# Patient Record
Sex: Female | Born: 1985
Health system: Southern US, Community
[De-identification: ages and names within clinical notes are randomized; demographics above are authoritative.]

## PROBLEM LIST (undated history)

## (undated) ENCOUNTER — Inpatient Hospital Stay (HOSPITAL_COMMUNITY): Payer: Self-pay

## (undated) DIAGNOSIS — D649 Anemia, unspecified: Secondary | ICD-10-CM

## (undated) DIAGNOSIS — F329 Major depressive disorder, single episode, unspecified: Secondary | ICD-10-CM

## (undated) DIAGNOSIS — N83209 Unspecified ovarian cyst, unspecified side: Secondary | ICD-10-CM

## (undated) DIAGNOSIS — A749 Chlamydial infection, unspecified: Secondary | ICD-10-CM

## (undated) DIAGNOSIS — F32A Depression, unspecified: Secondary | ICD-10-CM

## (undated) DIAGNOSIS — N390 Urinary tract infection, site not specified: Secondary | ICD-10-CM

## (undated) DIAGNOSIS — I10 Essential (primary) hypertension: Secondary | ICD-10-CM

## (undated) HISTORY — PX: NO PAST SURGERIES: SHX2092

---

## 2003-08-09 ENCOUNTER — Emergency Department (HOSPITAL_COMMUNITY): Admission: EM | Admit: 2003-08-09 | Discharge: 2003-08-10 | Payer: Self-pay

## 2004-01-07 ENCOUNTER — Encounter (INDEPENDENT_AMBULATORY_CARE_PROVIDER_SITE_OTHER): Payer: Self-pay | Admitting: *Deleted

## 2004-01-07 LAB — CONVERTED CEMR LAB

## 2004-01-10 ENCOUNTER — Encounter: Admission: RE | Admit: 2004-01-10 | Discharge: 2004-01-10 | Payer: Self-pay | Admitting: Family Medicine

## 2004-01-10 ENCOUNTER — Other Ambulatory Visit: Admission: RE | Admit: 2004-01-10 | Discharge: 2004-01-10 | Payer: Self-pay | Admitting: Family Medicine

## 2004-01-13 ENCOUNTER — Encounter: Admission: RE | Admit: 2004-01-13 | Discharge: 2004-01-13 | Payer: Self-pay | Admitting: Sports Medicine

## 2004-04-24 ENCOUNTER — Encounter: Admission: RE | Admit: 2004-04-24 | Discharge: 2004-04-24 | Payer: Self-pay | Admitting: Sports Medicine

## 2004-05-22 ENCOUNTER — Encounter: Admission: RE | Admit: 2004-05-22 | Discharge: 2004-05-22 | Payer: Self-pay | Admitting: Family Medicine

## 2004-06-10 ENCOUNTER — Encounter: Admission: RE | Admit: 2004-06-10 | Discharge: 2004-06-10 | Payer: Self-pay | Admitting: Family Medicine

## 2004-07-14 ENCOUNTER — Encounter: Admission: RE | Admit: 2004-07-14 | Discharge: 2004-07-14 | Payer: Self-pay | Admitting: Family Medicine

## 2004-07-31 ENCOUNTER — Encounter: Admission: RE | Admit: 2004-07-31 | Discharge: 2004-07-31 | Payer: Self-pay | Admitting: Family Medicine

## 2004-08-09 ENCOUNTER — Inpatient Hospital Stay (HOSPITAL_COMMUNITY): Admission: AD | Admit: 2004-08-09 | Discharge: 2004-08-10 | Payer: Self-pay | Admitting: *Deleted

## 2004-08-21 ENCOUNTER — Ambulatory Visit: Payer: Self-pay | Admitting: Family Medicine

## 2004-09-10 ENCOUNTER — Ambulatory Visit: Payer: Self-pay | Admitting: Family Medicine

## 2004-09-25 ENCOUNTER — Ambulatory Visit: Payer: Self-pay | Admitting: Family Medicine

## 2004-10-16 ENCOUNTER — Ambulatory Visit: Payer: Self-pay | Admitting: Family Medicine

## 2004-10-16 ENCOUNTER — Inpatient Hospital Stay (HOSPITAL_COMMUNITY): Admission: AD | Admit: 2004-10-16 | Discharge: 2004-10-16 | Payer: Self-pay | Admitting: Family Medicine

## 2004-10-18 ENCOUNTER — Inpatient Hospital Stay (HOSPITAL_COMMUNITY): Admission: AD | Admit: 2004-10-18 | Discharge: 2004-10-18 | Payer: Self-pay | Admitting: *Deleted

## 2004-10-24 ENCOUNTER — Inpatient Hospital Stay (HOSPITAL_COMMUNITY): Admission: AD | Admit: 2004-10-24 | Discharge: 2004-10-24 | Payer: Self-pay | Admitting: Obstetrics and Gynecology

## 2004-10-28 ENCOUNTER — Ambulatory Visit: Payer: Self-pay | Admitting: Family Medicine

## 2004-12-13 ENCOUNTER — Inpatient Hospital Stay (HOSPITAL_COMMUNITY): Admission: AD | Admit: 2004-12-13 | Discharge: 2004-12-13 | Payer: Self-pay | Admitting: Obstetrics & Gynecology

## 2004-12-17 ENCOUNTER — Ambulatory Visit: Payer: Self-pay | Admitting: Family Medicine

## 2004-12-29 ENCOUNTER — Ambulatory Visit: Payer: Self-pay | Admitting: *Deleted

## 2004-12-29 ENCOUNTER — Inpatient Hospital Stay (HOSPITAL_COMMUNITY): Admission: AD | Admit: 2004-12-29 | Discharge: 2005-01-01 | Payer: Self-pay | Admitting: *Deleted

## 2005-02-17 ENCOUNTER — Encounter (INDEPENDENT_AMBULATORY_CARE_PROVIDER_SITE_OTHER): Payer: Self-pay | Admitting: *Deleted

## 2005-02-17 ENCOUNTER — Other Ambulatory Visit: Admission: RE | Admit: 2005-02-17 | Discharge: 2005-02-17 | Payer: Self-pay | Admitting: Family Medicine

## 2005-02-17 ENCOUNTER — Ambulatory Visit: Payer: Self-pay | Admitting: Family Medicine

## 2006-06-30 ENCOUNTER — Emergency Department (HOSPITAL_COMMUNITY): Admission: EM | Admit: 2006-06-30 | Discharge: 2006-06-30 | Payer: Self-pay | Admitting: Emergency Medicine

## 2007-02-02 DIAGNOSIS — F17200 Nicotine dependence, unspecified, uncomplicated: Secondary | ICD-10-CM

## 2007-02-02 DIAGNOSIS — N83209 Unspecified ovarian cyst, unspecified side: Secondary | ICD-10-CM | POA: Insufficient documentation

## 2007-02-03 ENCOUNTER — Encounter (INDEPENDENT_AMBULATORY_CARE_PROVIDER_SITE_OTHER): Payer: Self-pay | Admitting: *Deleted

## 2007-06-15 ENCOUNTER — Emergency Department (HOSPITAL_COMMUNITY): Admission: EM | Admit: 2007-06-15 | Discharge: 2007-06-15 | Payer: Self-pay | Admitting: Emergency Medicine

## 2008-10-29 ENCOUNTER — Ambulatory Visit: Payer: Self-pay | Admitting: Family Medicine

## 2008-10-29 ENCOUNTER — Encounter (INDEPENDENT_AMBULATORY_CARE_PROVIDER_SITE_OTHER): Payer: Self-pay | Admitting: Family Medicine

## 2008-10-29 DIAGNOSIS — E663 Overweight: Secondary | ICD-10-CM | POA: Insufficient documentation

## 2008-10-29 LAB — CONVERTED CEMR LAB
Chlamydia, DNA Probe: NEGATIVE
GC Probe Amp, Genital: NEGATIVE
Pap Smear: NORMAL

## 2008-10-30 ENCOUNTER — Encounter (INDEPENDENT_AMBULATORY_CARE_PROVIDER_SITE_OTHER): Payer: Self-pay | Admitting: Family Medicine

## 2008-11-04 ENCOUNTER — Encounter (INDEPENDENT_AMBULATORY_CARE_PROVIDER_SITE_OTHER): Payer: Self-pay | Admitting: Family Medicine

## 2008-12-09 ENCOUNTER — Emergency Department (HOSPITAL_COMMUNITY): Admission: EM | Admit: 2008-12-09 | Discharge: 2008-12-09 | Payer: Self-pay | Admitting: Emergency Medicine

## 2009-01-27 ENCOUNTER — Emergency Department (HOSPITAL_COMMUNITY): Admission: EM | Admit: 2009-01-27 | Discharge: 2009-01-27 | Payer: Self-pay | Admitting: Neurology

## 2009-01-29 ENCOUNTER — Inpatient Hospital Stay (HOSPITAL_COMMUNITY): Admission: AD | Admit: 2009-01-29 | Discharge: 2009-01-29 | Payer: Self-pay | Admitting: Obstetrics & Gynecology

## 2009-01-31 ENCOUNTER — Inpatient Hospital Stay (HOSPITAL_COMMUNITY): Admission: RE | Admit: 2009-01-31 | Discharge: 2009-01-31 | Payer: Self-pay | Admitting: Obstetrics & Gynecology

## 2009-02-12 ENCOUNTER — Ambulatory Visit: Payer: Self-pay | Admitting: Family Medicine

## 2009-02-12 ENCOUNTER — Encounter: Payer: Self-pay | Admitting: Family Medicine

## 2009-02-12 LAB — CONVERTED CEMR LAB
Band Neutrophils: 0 % (ref 0–10)
Basophils Absolute: 0 10*3/uL (ref 0.0–0.1)
Basophils Relative: 0 % (ref 0–1)
HCT: 37.4 % (ref 36.0–46.0)
Hepatitis B Surface Ag: NEGATIVE
Monocytes Absolute: 0.6 10*3/uL (ref 0.1–1.0)
Neutro Abs: 5.5 10*3/uL (ref 1.7–7.7)
Neutrophils Relative %: 69 % (ref 43–77)
Platelets: 218 10*3/uL (ref 150–400)
RDW: 12.5 % (ref 11.5–15.5)
Rh Type: POSITIVE
WBC: 8 10*3/uL (ref 4.0–10.5)

## 2009-02-26 ENCOUNTER — Encounter: Payer: Self-pay | Admitting: Family Medicine

## 2009-02-26 ENCOUNTER — Ambulatory Visit: Payer: Self-pay | Admitting: Family Medicine

## 2009-02-26 LAB — CONVERTED CEMR LAB
Bilirubin Urine: NEGATIVE
GC Probe Amp, Genital: NEGATIVE
Glucose, Urine, Semiquant: NEGATIVE
Nitrite: NEGATIVE
Specific Gravity, Urine: 1.02
WBC Urine, dipstick: NEGATIVE
pH: 8

## 2009-03-03 ENCOUNTER — Encounter: Payer: Self-pay | Admitting: Family Medicine

## 2009-03-06 ENCOUNTER — Ambulatory Visit: Payer: Self-pay | Admitting: Family Medicine

## 2009-03-06 DIAGNOSIS — F39 Unspecified mood [affective] disorder: Secondary | ICD-10-CM

## 2009-03-11 ENCOUNTER — Ambulatory Visit: Payer: Self-pay | Admitting: Family Medicine

## 2009-03-11 ENCOUNTER — Inpatient Hospital Stay (HOSPITAL_COMMUNITY): Admission: AD | Admit: 2009-03-11 | Discharge: 2009-03-11 | Payer: Self-pay | Admitting: Obstetrics & Gynecology

## 2009-03-11 ENCOUNTER — Telehealth (INDEPENDENT_AMBULATORY_CARE_PROVIDER_SITE_OTHER): Payer: Self-pay | Admitting: Family Medicine

## 2009-03-11 LAB — CONVERTED CEMR LAB
Bilirubin Urine: NEGATIVE
Glucose, Urine, Semiquant: NEGATIVE
Ketones, urine, test strip: NEGATIVE
Nitrite: NEGATIVE
Protein, U semiquant: 30
Specific Gravity, Urine: 1.025
Urobilinogen, UA: 2
WBC Urine, dipstick: NEGATIVE
pH: 6.5

## 2009-03-27 ENCOUNTER — Other Ambulatory Visit: Payer: Self-pay | Admitting: *Deleted

## 2009-03-28 ENCOUNTER — Ambulatory Visit: Payer: Self-pay | Admitting: Family Medicine

## 2009-03-28 ENCOUNTER — Other Ambulatory Visit: Payer: Self-pay | Admitting: *Deleted

## 2009-03-28 ENCOUNTER — Other Ambulatory Visit: Payer: Self-pay | Admitting: Emergency Medicine

## 2009-03-28 ENCOUNTER — Inpatient Hospital Stay (HOSPITAL_COMMUNITY): Admission: AD | Admit: 2009-03-28 | Discharge: 2009-03-31 | Payer: Self-pay | Admitting: Internal Medicine

## 2009-03-28 ENCOUNTER — Ambulatory Visit: Payer: Self-pay | Admitting: Pulmonary Disease

## 2009-04-07 ENCOUNTER — Ambulatory Visit: Payer: Self-pay | Admitting: Family Medicine

## 2009-04-07 ENCOUNTER — Encounter: Payer: Self-pay | Admitting: Family Medicine

## 2009-04-28 ENCOUNTER — Encounter: Payer: Self-pay | Admitting: Family Medicine

## 2009-04-28 ENCOUNTER — Ambulatory Visit (HOSPITAL_COMMUNITY): Admission: RE | Admit: 2009-04-28 | Discharge: 2009-04-28 | Payer: Self-pay | Admitting: Family Medicine

## 2009-04-30 ENCOUNTER — Encounter: Payer: Self-pay | Admitting: Family Medicine

## 2009-05-07 ENCOUNTER — Ambulatory Visit: Payer: Self-pay | Admitting: Family Medicine

## 2009-05-19 ENCOUNTER — Telehealth: Payer: Self-pay | Admitting: *Deleted

## 2009-05-20 ENCOUNTER — Encounter: Payer: Self-pay | Admitting: Family Medicine

## 2009-05-23 ENCOUNTER — Encounter: Payer: Self-pay | Admitting: Family Medicine

## 2009-06-05 ENCOUNTER — Ambulatory Visit: Payer: Self-pay | Admitting: Family Medicine

## 2009-06-05 ENCOUNTER — Encounter: Payer: Self-pay | Admitting: Family Medicine

## 2009-06-06 ENCOUNTER — Encounter: Payer: Self-pay | Admitting: Family Medicine

## 2009-07-07 ENCOUNTER — Ambulatory Visit: Payer: Self-pay | Admitting: Family Medicine

## 2009-07-07 ENCOUNTER — Encounter: Payer: Self-pay | Admitting: Family Medicine

## 2009-07-07 LAB — CONVERTED CEMR LAB
RBC: 3.82 M/uL — ABNORMAL LOW (ref 3.87–5.11)
WBC: 6.6 10*3/uL (ref 4.0–10.5)

## 2009-07-11 ENCOUNTER — Encounter: Payer: Self-pay | Admitting: Family Medicine

## 2009-07-11 ENCOUNTER — Ambulatory Visit: Payer: Self-pay | Admitting: Family Medicine

## 2009-07-22 ENCOUNTER — Ambulatory Visit: Payer: Self-pay | Admitting: Family Medicine

## 2009-07-22 LAB — CONVERTED CEMR LAB
Bilirubin Urine: NEGATIVE
Glucose, Urine, Semiquant: 100
Ketones, urine, test strip: NEGATIVE
Specific Gravity, Urine: 1.025

## 2009-07-31 ENCOUNTER — Encounter: Payer: Self-pay | Admitting: Family Medicine

## 2009-08-04 ENCOUNTER — Encounter: Payer: Self-pay | Admitting: Family Medicine

## 2009-08-13 ENCOUNTER — Ambulatory Visit: Payer: Self-pay | Admitting: Family Medicine

## 2009-08-15 ENCOUNTER — Ambulatory Visit (HOSPITAL_COMMUNITY): Admission: RE | Admit: 2009-08-15 | Discharge: 2009-08-15 | Payer: Self-pay | Admitting: Family Medicine

## 2009-08-15 ENCOUNTER — Encounter: Payer: Self-pay | Admitting: Family Medicine

## 2009-08-29 ENCOUNTER — Encounter: Payer: Self-pay | Admitting: Family Medicine

## 2009-08-29 ENCOUNTER — Ambulatory Visit: Payer: Self-pay | Admitting: Family Medicine

## 2009-09-05 ENCOUNTER — Ambulatory Visit: Payer: Self-pay | Admitting: Family Medicine

## 2009-09-12 ENCOUNTER — Ambulatory Visit: Payer: Self-pay | Admitting: Family Medicine

## 2009-09-12 ENCOUNTER — Encounter: Payer: Self-pay | Admitting: Family Medicine

## 2009-09-18 ENCOUNTER — Ambulatory Visit: Payer: Self-pay | Admitting: Family Medicine

## 2009-09-25 ENCOUNTER — Ambulatory Visit: Payer: Self-pay | Admitting: Family Medicine

## 2009-09-28 ENCOUNTER — Inpatient Hospital Stay (HOSPITAL_COMMUNITY): Admission: AD | Admit: 2009-09-28 | Discharge: 2009-09-30 | Payer: Self-pay | Admitting: Family Medicine

## 2009-09-28 ENCOUNTER — Ambulatory Visit: Payer: Self-pay | Admitting: Advanced Practice Midwife

## 2009-11-03 ENCOUNTER — Ambulatory Visit: Payer: Self-pay | Admitting: Family Medicine

## 2009-11-10 ENCOUNTER — Telehealth: Payer: Self-pay | Admitting: *Deleted

## 2009-11-10 ENCOUNTER — Encounter: Payer: Self-pay | Admitting: Family Medicine

## 2009-11-21 ENCOUNTER — Telehealth: Payer: Self-pay | Admitting: *Deleted

## 2010-01-30 IMAGING — US US OB COMP LESS 14 WK
1 series · 14 of 28 positions shown · non-contrast
Comparison: 06/20/2006

CLINICAL DATA: OBSTETRIC <14 WK ULTRASOUND,TRANSVAGINAL OB ULTRASOUND - MODIFY
TECHNIQUE: Cramps/left abdominal pain/early pregnancy

[Series 1: us ob comp less 14 wk · 0.21mm/px · 14 of 70 slices shown]
[im 3/70]
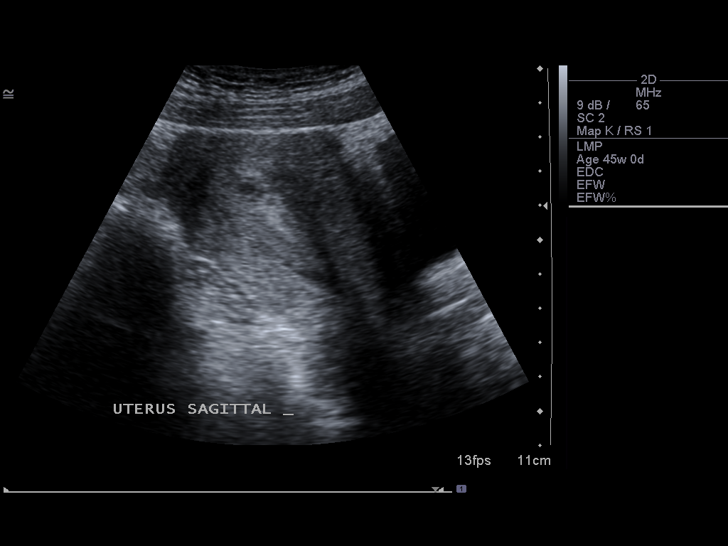
[im 8/70]
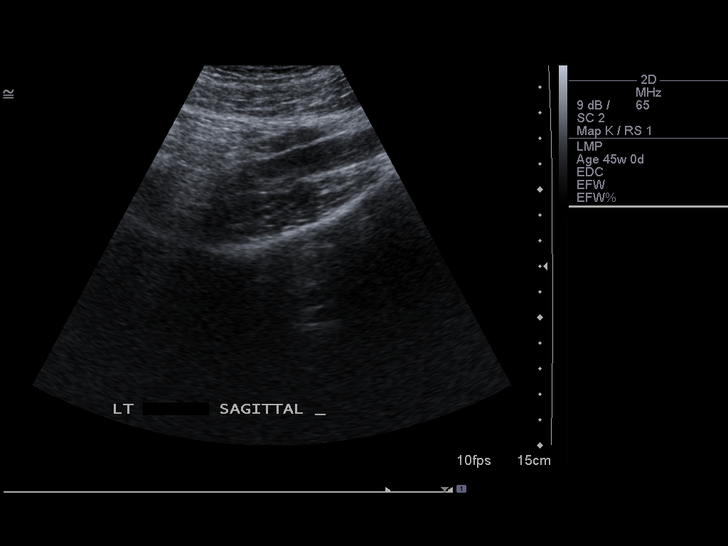
[im 13/70]
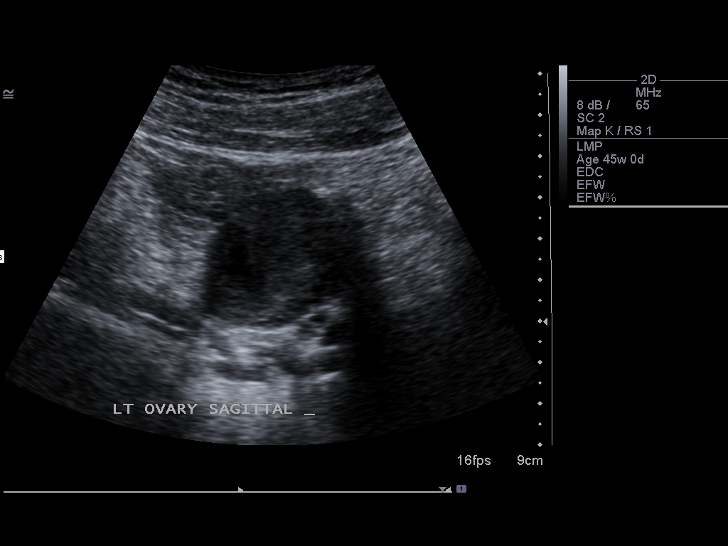
[im 18/70]
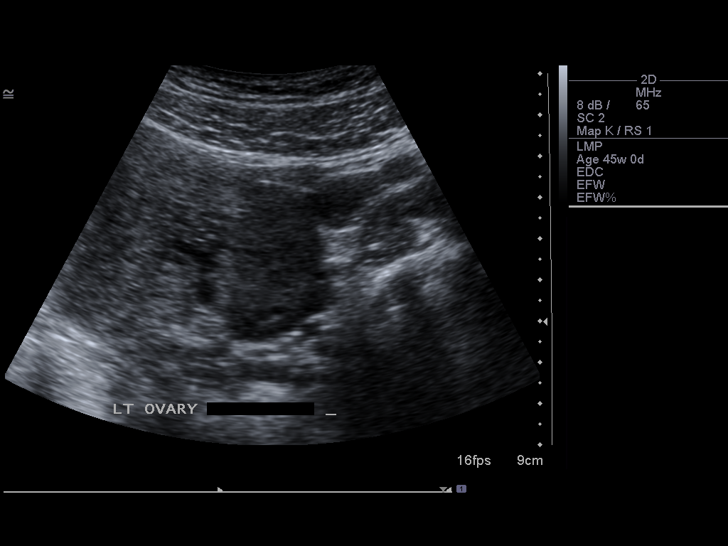
[im 24/70]
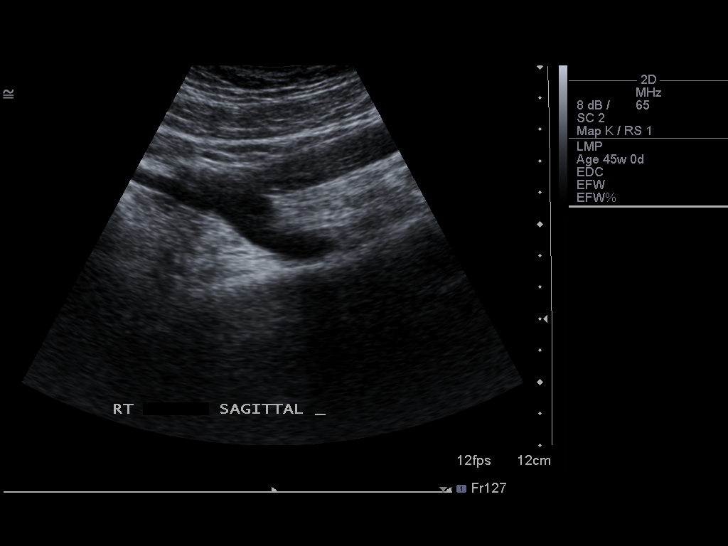
[im 29/70]
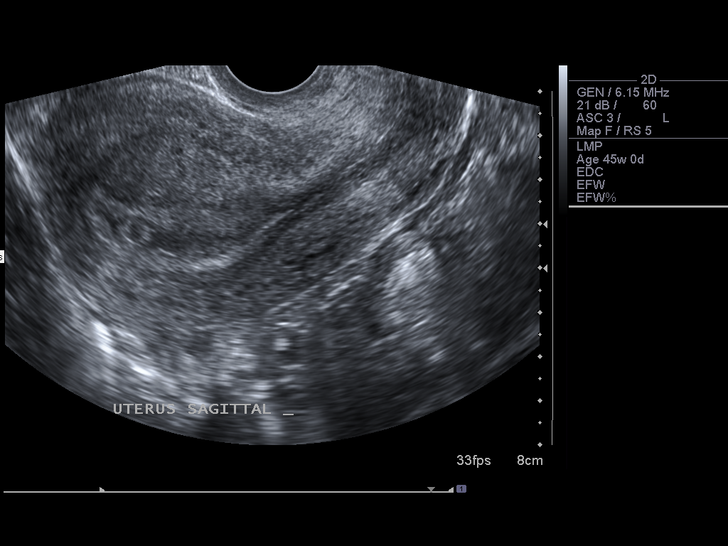
[im 34/70]
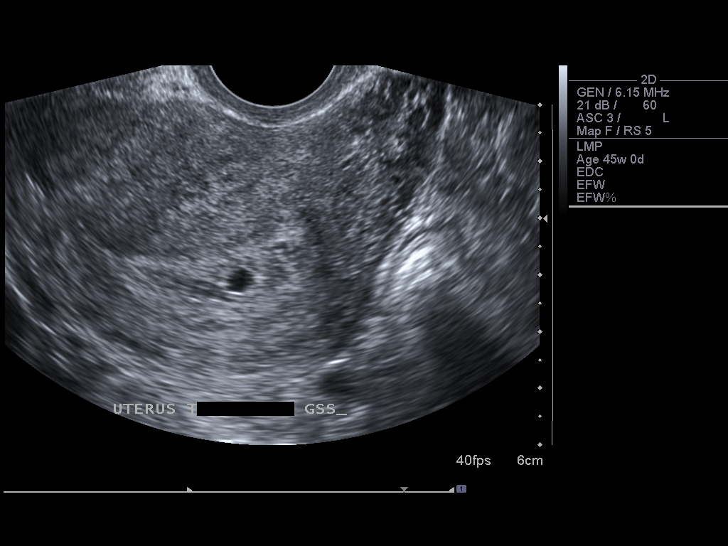
[im 39/70]
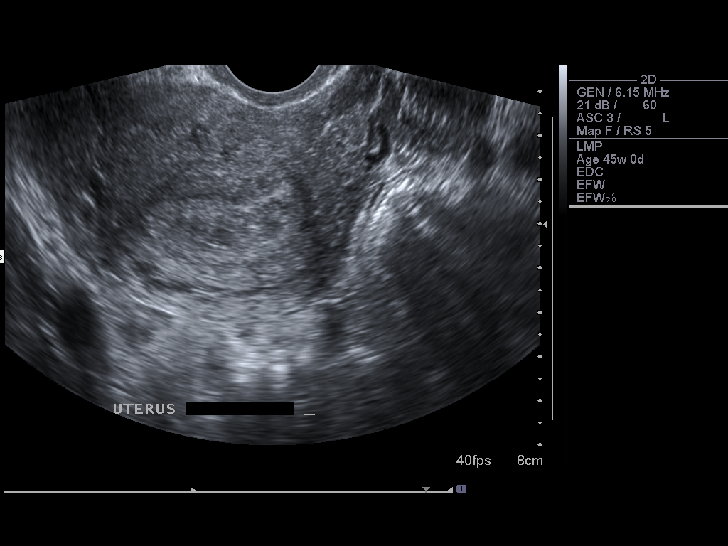
[im 44/70]
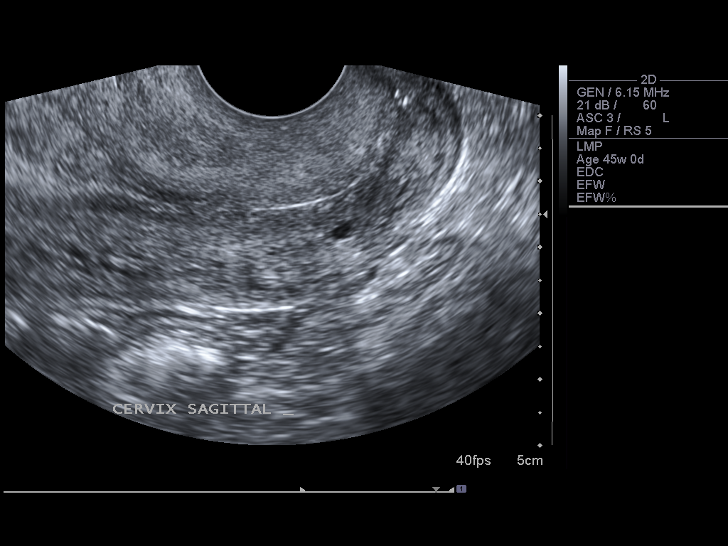
[im 49/70]
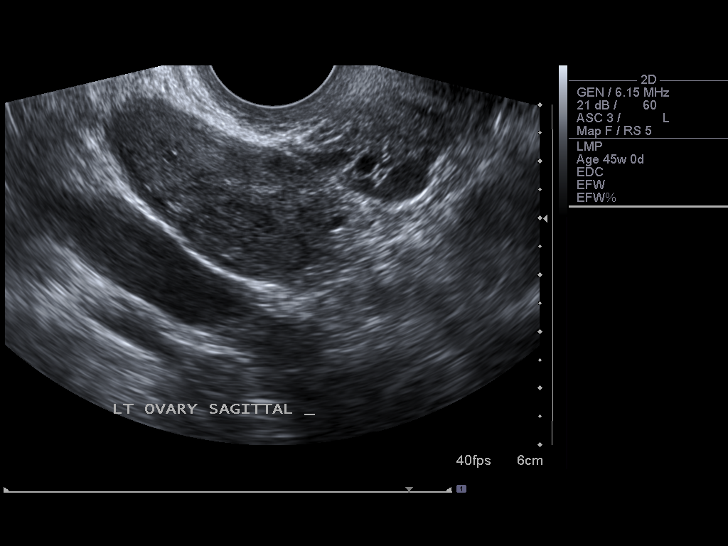
[im 54/70]
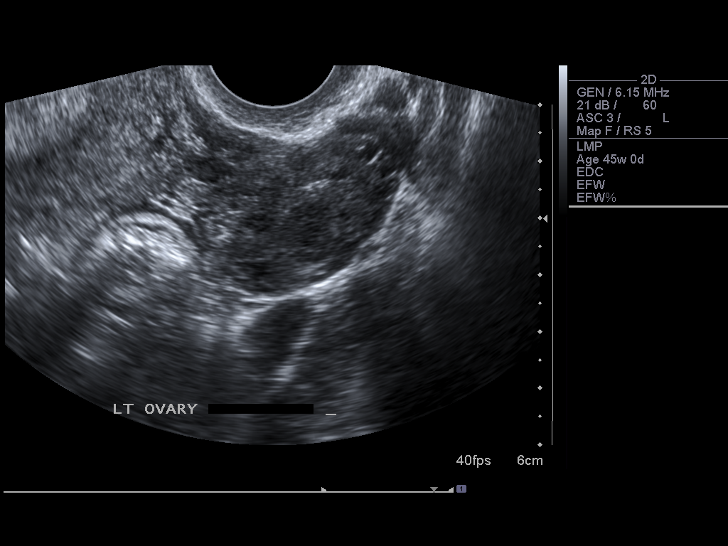
[im 59/70]
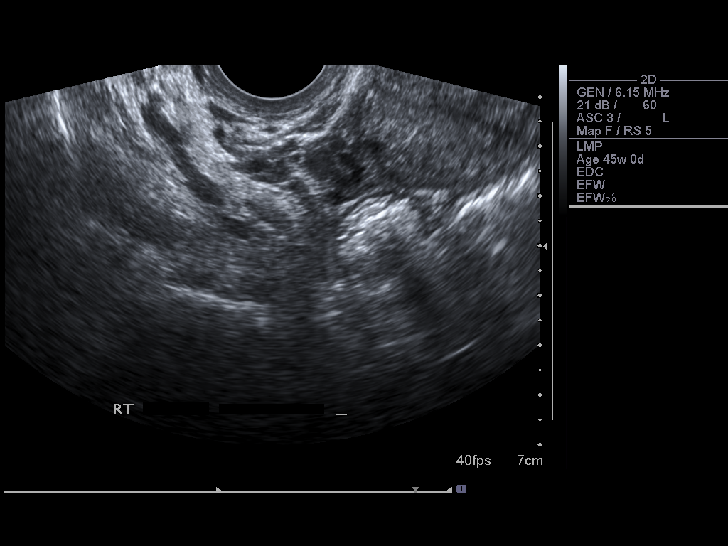
[im 64/70]
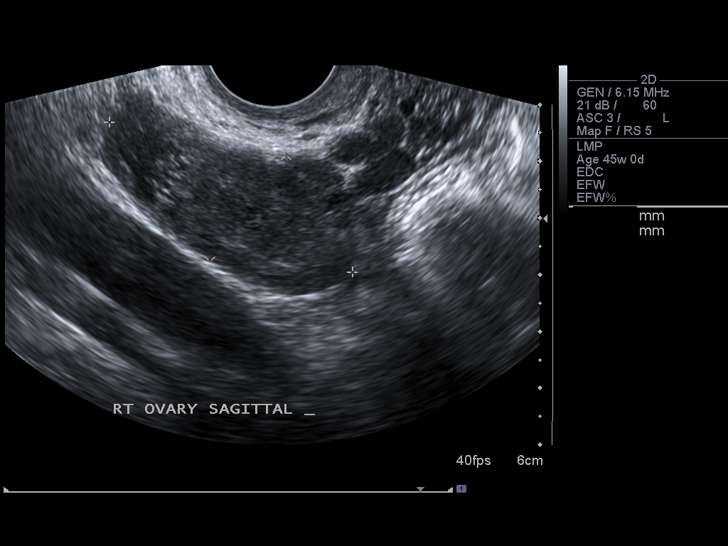
[im 70/70]
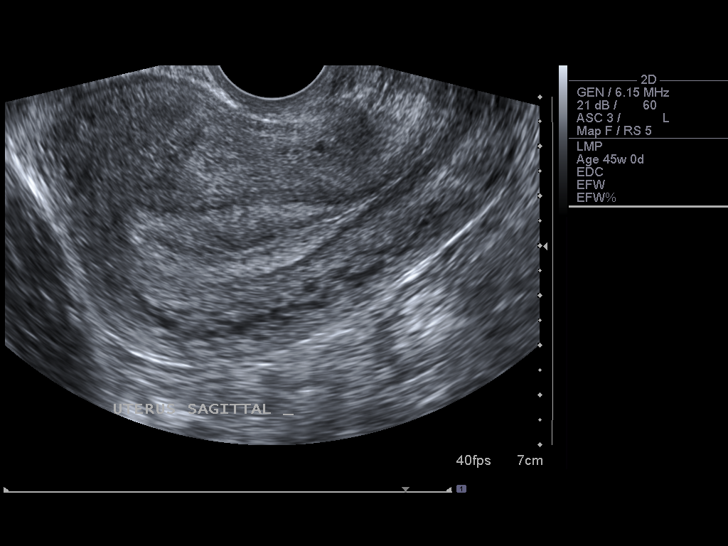

[14 of 28 positions shown; findings below may reference images not displayed]

FINDINGS: There is an intrauterine gestational sac measuring
mm.  This is consistent with a gestational age of 5 weeks 1 day.
At this time there is no yolk sac or visible fetus.

Right ovary normal.  Left ovary shows a 2 cm thick-walled cyst that
may be an involuting corpus luteum cyst.  No other pathological
findings. No free pelvic fluid.
IMPRESSION: 1.  Early intrauterine pregnancy estimated at 5 weeks 1 day based
on gestational sac diameter.
2.  At this time there is no visible yolk sac or embryo.
3.  Thick-walled left ovarian cyst measuring about 2 cm.

## 2010-12-27 ENCOUNTER — Encounter: Payer: Self-pay | Admitting: Family Medicine

## 2010-12-28 ENCOUNTER — Encounter: Payer: Self-pay | Admitting: Family Medicine

## 2011-01-05 NOTE — Assessment & Plan Note (Signed)
Summary: pain in L side & nausea   Vital Signs:  Patient profile:   25 year old female Height:      70 inches Weight:      186 pounds Temp:     99.2 degrees F oral Pulse rate:   92 / minute BP sitting:   122 / 77  (right arm)  Vitals Entered By: deseree blount/sma CC: abdominal pain, pregnant Is Patient Diabetic? No Pain Assessment Patient in pain? yes     Location: abdomen Intensity: 9   History of Present Illness: [name] is a 25 year old G3P1011 at 11.[redacted] weeks GA presenting for left abdominal pain.  1. Abdominal Pain: x 1 day, described as intermittent, sharp, cramping, 9/10, lower left abdomen. She stated that yesterday, she felt fine in the am and afternoon, ate a [employer] biscuit, donuts, chips, pizza. That evening, she began to feel abdominal pain. She has had several BMs- soft, green, no blood or mucus- with mild relief. Sleeping and sitting in the fetal position helps. Nothing seems to make worse. She endorses nausea but denies vomiting.   The patient had an OB US on 3/26 that showed that she did have an intrauterine pregnancy, a left corpus luteum, size = dates. She denies vaginal bleeding or discharge, contractions, changes in urination, recent STD exposure, Hx of kidney stones, sick contacts.  Habits & Providers     Tobacco Status: never  Current Medications (verified): 1)  Prenatal Vitamins 0.8 Mg Tabs (Prenatal Multivit-Min-Fe-Fa) .... One Tablet By Mouth Daily 2)  Vitamin B-6 25 Mg Tabs (Pyridoxine Hcl) .... By Mouth One Tablet Every 6 Hour For Nausea 3)  Unisom 25 Mg Tabs (Doxylamine Succinate (Sleep)) .... One Tablet By Mouth Every 6-8 Hours For Nausea.  May Cause Sleepiness.  Allergies: No Known Drug Allergies  Past History:  Past Medical History:    U/S revealing polycystic ovaries 2/05    Migraines (10/29/2008)  Review of Systems       Per HPI, otherwise negative  Physical Exam  General:  Well-developed,well-nourished,in no acute distress;  alert,appropriate and cooperative throughout examination. vital signs reviewed. Heart:  Normal rate and regular rhythm. S1 and S2 normal without gallop, murmur, click, rub or other extra sounds. Abdomen:  Soft, non-tender, normal bowel sounds, no guarding, no rigidity, no hepatomegaly, no splenomegaly, no CVA tenderness.  Attempted to hear heartbeat with doptone, but was not successful.  Skin:  Intact without suspicious lesions or rashes   Impression & Recommendations:  Problem # 1:  GASTROENTERITIS (ICD-558.9) Assessment New  Abdominal pain consistent with gastroenteritis. NO RED FLAGS. Discussed RED FLAGS for promptly seeking medical care with patient.  Orders: [facility]- Est Level  3 (16109)  Problem # 2:  PREGNANCY, NORMAL (ICD-V22.2) Assessment: Unchanged 25 year old G7P1011 (elective abortion) at 11 weeks, 1 day GA. The patient was anxious after I was unsuccessful in hearing heartbeat. I explained that it it still very early in her pregnancy and that it was not surprising that we did not hear them. She has an US showing an IUP. She denies bleeding or fluid per vagina. Vital signs normal. NO RED FLAGS. Likely GE as above. Advised patient to follow up with PCP (Dr. Janalyn Harder for OB) in 1 week. Given Red Flags. Precepted patient with Dr. Leveda Anna.  Orders: Urinalysis-[facility] (00000) [facility]- Est Level  3 (60454)  Complete Medication List: 1)  Prenatal Vitamins 0.8 Mg Tabs (Prenatal multivit-min-fe-fa) .... One tablet by mouth daily 2)  Vitamin B-6 25  Mg Tabs (Pyridoxine hcl) .... By mouth one tablet every 6 hour for nausea 3)  Unisom 25 Mg Tabs (Doxylamine succinate (sleep)) .... One tablet by mouth every 6-8 hours for nausea.  may cause sleepiness.  Patient Instructions: 1)  Follow up with Dr. Janalyn Harder in 1-2 weeks. 2)  If you have any bleeding or increased cramping, please call the [facility] or go to the [facility] Emergency Dept. 3)  Make sure that you drink pleanty of  fluids.  Laboratory Results   Urine Tests  Date/Time Received: March 11, 2009 3:00 PM  Date/Time Reported: March 11, 2009 3:17 PM   Routine Urinalysis   Color: yellow Appearance: Clear Glucose: negative   (Normal Range: Negative) Bilirubin: negative   (Normal Range: Negative) Ketone: negative   (Normal Range: Negative) Spec. Gravity: 1.025   (Normal Range: 1.003-1.035) Blood: trace-intact   (Normal Range: Negative) pH: 6.5   (Normal Range: 5.0-8.0) Protein: 30   (Normal Range: Negative) Urobilinogen: 2.0   (Normal Range: 0-1) Nitrite: negative   (Normal Range: Negative) Leukocyte Esterace: negative   (Normal Range: Negative)  Urine Microscopic WBC/HPF: occ RBC/HPF: 0-3 Bacteria/HPF: 2+ cocci Epithelial/HPF: 5-10 with occ clue cell    Comments: ...............test performed by......Marland KitchenBonnie A. [place], [place] (ASCP)

## 2011-03-11 LAB — CBC
Hemoglobin: 10 g/dL — ABNORMAL LOW (ref 12.0–15.0)
Hemoglobin: 11.6 g/dL — ABNORMAL LOW (ref 12.0–15.0)
MCHC: 33.4 g/dL (ref 30.0–36.0)
MCHC: 33.9 g/dL (ref 30.0–36.0)
MCV: 87.8 fL (ref 78.0–100.0)
RBC: 3.9 MIL/uL (ref 3.87–5.11)
RDW: 13.1 % (ref 11.5–15.5)
WBC: 7.9 10*3/uL (ref 4.0–10.5)

## 2011-03-11 LAB — COMPREHENSIVE METABOLIC PANEL
AST: 34 U/L (ref 0–37)
Albumin: 3.1 g/dL — ABNORMAL LOW (ref 3.5–5.2)
Alkaline Phosphatase: 131 U/L — ABNORMAL HIGH (ref 39–117)
BUN: 10 mg/dL (ref 6–23)
CO2: 21 mEq/L (ref 19–32)
Chloride: 104 mEq/L (ref 96–112)
Creatinine, Ser: 0.69 mg/dL (ref 0.4–1.2)
GFR calc Af Amer: >60 mL/min (ref >60)
GFR calc non Af Amer: >60 mL/min (ref >60)
Potassium: 4 mEq/L (ref 3.5–5.1)
Total Bilirubin: 0.7 mg/dL (ref 0.3–1.2)

## 2011-03-11 LAB — RPR: RPR Ser Ql: NONREACTIVE

## 2011-03-13 LAB — GLUCOSE, CAPILLARY: Glucose-Capillary: 154 mg/dL — ABNORMAL HIGH (ref 70–99)

## 2011-03-17 LAB — MAGNESIUM
Magnesium: 1.9 mg/dL (ref 1.5–2.5)
Magnesium: 2 mg/dL (ref 1.5–2.5)

## 2011-03-17 LAB — URINALYSIS, ROUTINE W REFLEX MICROSCOPIC
Bilirubin Urine: NEGATIVE
Glucose, UA: 100 mg/dL — AB
Hgb urine dipstick: NEGATIVE
Ketones, ur: NEGATIVE mg/dL
Leukocytes, UA: NEGATIVE
Nitrite: NEGATIVE
Nitrite: NEGATIVE
Nitrite: NEGATIVE
Protein, ur: 100 mg/dL — AB
Specific Gravity, Urine: 1.01 (ref 1.005–1.030)
Urobilinogen, UA: 0.2 mg/dL (ref 0.0–1.0)
Urobilinogen, UA: 0.2 mg/dL (ref 0.0–1.0)
pH: 8.5 — ABNORMAL HIGH (ref 5.0–8.0)

## 2011-03-17 LAB — BASIC METABOLIC PANEL
BUN: 7 mg/dL (ref 6–23)
BUN: 4 mg/dL — ABNORMAL LOW (ref 6–23)
BUN: 5 mg/dL — ABNORMAL LOW (ref 6–23)
CO2: 16 mEq/L — ABNORMAL LOW (ref 19–32)
CO2: 24 mEq/L (ref 19–32)
Calcium: 8.2 mg/dL — ABNORMAL LOW (ref 8.4–10.5)
Calcium: 8.3 mg/dL — ABNORMAL LOW (ref 8.4–10.5)
Chloride: 111 mEq/L (ref 96–112)
Chloride: 106 mEq/L (ref 96–112)
Chloride: 108 mEq/L (ref 96–112)
Creatinine, Ser: 0.73 mg/dL (ref 0.4–1.2)
Creatinine, Ser: 0.9 mg/dL (ref 0.4–1.2)
GFR calc Af Amer: >60 mL/min (ref >60)
GFR calc Af Amer: >60 mL/min (ref >60)
GFR calc non Af Amer: >60 mL/min (ref >60)
GFR calc non Af Amer: >60 mL/min (ref >60)
GFR calc non Af Amer: >60 mL/min (ref >60)
Glucose, Bld: 130 mg/dL — ABNORMAL HIGH (ref 70–99)
Glucose, Bld: 80 mg/dL (ref 70–99)
Glucose, Bld: 93 mg/dL (ref 70–99)
Potassium: 2.8 mEq/L — ABNORMAL LOW (ref 3.5–5.1)
Potassium: 2.9 mEq/L — ABNORMAL LOW (ref 3.5–5.1)
Sodium: 133 mEq/L — ABNORMAL LOW (ref 135–145)
Sodium: 135 mEq/L (ref 135–145)
Sodium: 138 mEq/L (ref 135–145)

## 2011-03-17 LAB — COMPREHENSIVE METABOLIC PANEL
ALT: 21 U/L (ref 0–35)
Alkaline Phosphatase: 43 U/L (ref 39–117)
Chloride: 104 mEq/L (ref 96–112)
Glucose, Bld: 108 mg/dL — ABNORMAL HIGH (ref 70–99)
Potassium: 3.4 mEq/L — ABNORMAL LOW (ref 3.5–5.1)
Sodium: 135 mEq/L (ref 135–145)
Total Protein: 6.5 g/dL (ref 6.0–8.3)

## 2011-03-17 LAB — URINE MICROSCOPIC-ADD ON

## 2011-03-17 LAB — POCT I-STAT, CHEM 8
Calcium, Ion: 1.06 mmol/L — ABNORMAL LOW (ref 1.12–1.32)
Creatinine, Ser: 0.8 mg/dL (ref 0.4–1.2)
Glucose, Bld: 102 mg/dL — ABNORMAL HIGH (ref 70–99)
Hemoglobin: 12.2 g/dL (ref 12.0–15.0)
Sodium: 135 mEq/L (ref 135–145)
TCO2: 21 mmol/L (ref 0–100)

## 2011-03-17 LAB — POCT I-STAT 3, ART BLOOD GAS (G3+)
Acid-Base Excess: 2 mmol/L (ref 0.0–2.0)
Bicarbonate: 22.5 mEq/L (ref 20.0–24.0)
Bicarbonate: 24.1 mEq/L — ABNORMAL HIGH (ref 20.0–24.0)
Bicarbonate: 26.3 mEq/L — ABNORMAL HIGH (ref 20.0–24.0)
Patient temperature: 98.4
TCO2: 23 mmol/L (ref 0–100)
TCO2: 27 mmol/L (ref 0–100)
pCO2 arterial: 25.2 mmHg — ABNORMAL LOW (ref 35.0–45.0)
pH, Arterial: 7.435 — ABNORMAL HIGH (ref 7.350–7.400)
pH, Arterial: 7.589 — ABNORMAL HIGH (ref 7.350–7.400)
pO2, Arterial: 100 mmHg (ref 80.0–100.0)

## 2011-03-17 LAB — RAPID URINE DRUG SCREEN, HOSP PERFORMED
Amphetamines: NOT DETECTED
Tetrahydrocannabinol: NOT DETECTED

## 2011-03-17 LAB — WET PREP, GENITAL: Trich, Wet Prep: NONE SEEN

## 2011-03-17 LAB — CBC
Platelets: 178 10*3/uL (ref 150–400)
RDW: 12.2 % (ref 11.5–15.5)
WBC: 8.3 10*3/uL (ref 4.0–10.5)

## 2011-03-17 LAB — SALICYLATE LEVEL
Salicylate Lvl: 25.1 mg/dL — ABNORMAL HIGH (ref 2.8–20.0)
Salicylate Lvl: 16 mg/dL (ref 2.8–20.0)
Salicylate Lvl: 23.3 mg/dL — ABNORMAL HIGH (ref 2.8–20.0)

## 2011-03-17 LAB — TRICYCLICS SCREEN, URINE: TCA Scrn: NOT DETECTED

## 2011-03-17 LAB — GLUCOSE, CAPILLARY

## 2011-03-17 LAB — PREGNANCY, URINE: Preg Test, Ur: POSITIVE

## 2011-03-22 LAB — URINALYSIS, ROUTINE W REFLEX MICROSCOPIC
Bilirubin Urine: NEGATIVE
Glucose, UA: NEGATIVE mg/dL
Nitrite: NEGATIVE
Specific Gravity, Urine: 1.026 (ref 1.005–1.030)
pH: 7.5 (ref 5.0–8.0)

## 2011-03-22 LAB — POCT PREGNANCY, URINE: Preg Test, Ur: NEGATIVE

## 2011-03-23 LAB — DIFFERENTIAL
Basophils Absolute: 0 10*3/uL (ref 0.0–0.1)
Lymphocytes Relative: 23 % (ref 12–46)
Monocytes Absolute: 0.4 10*3/uL (ref 0.1–1.0)
Monocytes Relative: 6 % (ref 3–12)
Neutro Abs: 4.7 10*3/uL (ref 1.7–7.7)

## 2011-03-23 LAB — GC/CHLAMYDIA PROBE AMP, GENITAL: Chlamydia, DNA Probe: NEGATIVE

## 2011-03-23 LAB — URINALYSIS, ROUTINE W REFLEX MICROSCOPIC
Glucose, UA: NEGATIVE mg/dL
Protein, ur: NEGATIVE mg/dL
Urobilinogen, UA: 0.2 mg/dL (ref 0.0–1.0)

## 2011-03-23 LAB — CBC
HCT: 39.5 % (ref 36.0–46.0)
MCV: 85.2 fL (ref 78.0–100.0)
Platelets: 198 10*3/uL (ref 150–400)
RDW: 12.1 % (ref 11.5–15.5)

## 2011-03-23 LAB — COMPREHENSIVE METABOLIC PANEL
Albumin: 3.8 g/dL (ref 3.5–5.2)
BUN: 15 mg/dL (ref 6–23)
Creatinine, Ser: 0.89 mg/dL (ref 0.4–1.2)
Total Protein: 6.9 g/dL (ref 6.0–8.3)

## 2011-03-23 LAB — WET PREP, GENITAL: Yeast Wet Prep HPF POC: NONE SEEN

## 2011-04-20 NOTE — H&P (Signed)
NAMEJERMISHA, [name] NO.:  [id number]   MEDICAL RECORD NO.:  [id number]          PATIENT TYPE:  INP   LOCATION:  NA                           FACILITY:  [facility]   PHYSICIAN:  Loma Sender, MD         DATE OF BIRTH:  01-12-1986   DATE OF ADMISSION:  03/27/2009  DATE OF DISCHARGE:                              HISTORY & PHYSICAL   ADMITTING DIAGNOSIS:  Salicylate overdose.   HISTORY OF PRESENT ILLNESS:  [name] is a 25 year old female with a  past medical history significant for just completing her first trimester  of her second pregnancy who presents to the emergency department after  attempting suicide earlier this evening by ingesting approximately 125  tablets of aspirin with the strength of 325 mg tabs.  The patient  reports that she had been depressed lately and decided this evening that  she wanted to end her life.  At approximately 10:00 p.m., she started  ingesting 125 tablets of aspirin and did so in approximately a 30-minute  time frame.  Her boyfriend found her and called EMS who brought her to  the emergency department for evaluation.  In the emergency department,  she was administered activated charcoal at presentation and  approximately 2 hours after presentation.  The patient denies ingesting  any other medications but does report a history of suicidal attempts in  the past.  During our interview, the patient complained of tinnitus and  was diaphoretic.  Otherwise, the patient had no other complaints.   PAST MEDICAL HISTORY:  The patient is G1, P1, L1, otherwise the patient  denies any other past medical history.   MEDICATIONS:  The patient takes prenatal vitamins.   ALLERGIES:  The patient has no known allergies.   FAMILY HISTORY:  The patient denies any diseases run in her family.   SOCIAL HISTORY:  The patient was prior self-care [facility].  She works for [employer].  She does not smoke, use illicit drugs or  drink alcohol.  She  reports growing up in [place], but moved to [place] to be with her grandmother after growing up in a poor social  situation with her parents.   REVIEW OF SYSTEMS:  Complete review of systems was performed, otherwise  negative unless noted in the history of present illness.   PHYSICAL EXAM:  VITAL SIGNS: Temperature 98.4, blood pressure 119/61,  pulse 123, respiratory 22, satting 96% on room air.  GENERAL: [name] in no acute distress.  HEENT: The patient is diaphoretic at the brow.  Extraocular movement are  intact.  Mucous membranes are moist.  Nares are patent bilaterally.  The  patient is slightly hard of hearing, complained of tenderness.  NECK: Supple without lymphadenopathy, jugular venous distention or  carotid bruit.  The patient is tachypneic without respiratory distress.  LUNGS: Clear to auscultation bilaterally without wheezes or rhonchi.  HEART: Tachycardiac but without rubs, gallops or murmurs.  Pulses are 2+  at the wrist and ankle, no lower extremity edema.  ABDOMEN:  Soft,  nontender, nondistended, appropriate for her term of pregnancy.  NEUROLOGIC:  Cranial nerves II-XII are intact.  Muscle strength is  symmetric.  Sensation is grossly normal.  SKIN: No rashes or lesions are noted.  MENTAL STATUS.  The patient is alert and oriented x4.  Memory is intact  to recent and distant events.  Mood is depressed.  Affect is flat.  MUSCULOSKELETAL: No bony abnormalities, muscle tenderness or active  synovitis.   LABORATORY DATA:  Laboratory data at presentation:  INR was 1.1 and PTT  29.  CMP shows sodium of 135, potassium 3.4, chloride 104, bicarb 22,  glucose 108, BUN 7, creatinine 0.74, T bili 0.4, alk phos 43, AST 27,  ALT 21, total protein 6.5, albumin 3.5, calcium 9.3.  Alcohol level  undetectable, magnesium 1.9.  Acetaminophen level undetectable.  Salicylate level at 2345 on March 27, 2009 was 25.1.  Salicylate level  at 0224 April  23.2010 was 53.3.  Urinalysis was negative.  Urine  pregnancy was positive.  Urine drug screen was negative, Tricyclic level  was negative.  Repeat BMP on March 28, 2009 at 4 a.m.:  Sodium 137,  potassium 3.4, chloride 111, bicarb 16, glucose 130, BUN 7, creatinine  0.73, calcium 8.3.   ASSESSMENT/PLAN:  [name] who just completed her  first trimester pregnancy who presents with salicylate overdose in a  suicide attempt:  1. Salicylate overdose:  The patient currently has had 2 rounds of      charcoal but still shows a significant elevation in her salicylate      level as well as is objectively symptomatic with tinnitus and      hyperventilation with dropping bicarb.  At this point, we will      consult Nephrology for assistance with probable dialysis to be done      at the Intensive Care Unit at [facility].  We have  initiated the      patient on a bicarb drip at 250 mL an hour with 40 of KCL and we      will titrate this infusion with a goal urine output of 160 mL of      urine output an hour.  We will go ahead and place a Foley catheter      at this point.  We will monitor BMP, urine pH and salicylate level      q.2 h.  2. Suicide attempt:  At this point, the patient will be in the      Intensive Care Unit under close observation.  We will monitor      suicide precautions and plan to consult Psychiatry when the patient      is out of the Intensive Care Unit.  3. Pregnancy:  At this point, I have consulted Obstetrics to evaluate      the patient.  Their initial impression is to do whenever is needed      to support the mother and worry about infant later.  They will see      the patient today and please a note on the chart with any other      recommendations.  4. Prophylaxis:  Heparin 5000 units subcu q.8 h.  5. Fluids, electrolytes and nutrition: __________  6. Ethics:  The patient is a full code.      Loma Sender, MD     TJ/MEDQ  D:  03/28/2009  T:   03/28/2009  Job:  [id number]

## 2011-04-20 NOTE — Consult Note (Signed)
NAMEMALERIE, Pennington NO.:  [id number]   MEDICAL RECORD NO.:  [id number]          PATIENT TYPE:  INP   LOCATION:  2105                         FACILITY:  [facility]   PHYSICIAN:  Antonietta Breach, M.D.  DATE OF BIRTH:  1986-12-06   DATE OF CONSULTATION:  03/28/2009  DATE OF DISCHARGE:                                 CONSULTATION   REQUESTING PHYSICIAN:  [facility].   REASON FOR CONSULTATION:  Overdose.   HISTORY OF PRESENT ILLNESS:  [name] is a 25 year old female in her  first trimester of pregnancy who deliberately overdosed on 100 tablets  of aspirin in a suicide attempt.   She describes the recurrence of depression with approximately 3 weeks of  depressed mood, low energy, difficulty concentrating, anhedonia,  insomnia and suicidal thoughts.  She acknowledges that this was a  suicide attempt.   Efforts to contact her friends for support have been nonaffective.  She  has had very poor support for her first baby with the father of that  baby mainly staying away.  She also has been under much stress with her  job at [employer].  She cannot perform some of the physical tasks that she was  capable of doing prior to pregnancy.  Her depressive symptoms have been  progressive in fashion.   PAST PSYCHIATRIC HISTORY:  She does have a history of prior suicide  attempts including an overdose with Tylenol, an overdose with morphine  and cutting her wrists.   She does not have any history of increased energy or decreased need for  sleep.  She has no history of psychiatric treatment.  In review of the  past medical records there is no history of psychiatric treatment.   FAMILY PSYCHIATRIC HISTORY:  None known.   SOCIAL HISTORY:  [name] was raised by her father.  Her mother was not  in the home from a very early age.  [name] has not married.  Please  see the above history.  The father of this current baby is ambivalent  about the future, however at the present  time he is providing some  support.   [name] states that she feels severe guilt daily about an abortion that  she underwent approximately 2 years ago.  She is not using illegal drugs  or alcohol.   PAST MEDICAL HISTORY:  Aspirin overdose, now in the intensive care unit.  The assessment of the fetus through ultrasound so far is normal.   MEDICATIONS:  The MAR is reviewed.  She is not on any psychotropic  medication.  She has NO KNOWN DRUG ALLERGIES.   LABORATORY DATA:  Sodium 135, BUN 6, creatinine 0.9, glucose 80, aspirin  is currently 16 and so far the aspirin level has peaked at 53.3 at  approximately 2 o'clock this morning.   Tricyclics negative.  Urine drug screen negative.  Tylenol negative.  Magnesium normal.  Alcohol negative.  SGOT 27, SGPT 21.   REVIEW OF SYSTEMS:  Constitutional, head, eyes, ears, nose, throat,  mouth, neurologic, psychiatric, cardiovascular, respiratory,  gastrointestinal, genitourinary, skin, musculoskeletal,  hematologic,  lymphatic, endocrine, metabolic all unremarkable.   EXAMINATION:  The undersigned discussed the case thoroughly with the  attending Team.  VITAL SIGNS:  Temperature 97.9, pulse 105, respiratory rate 21, blood  pressure 111/58, O2 saturation on room air 100%.  GENERAL APPEARANCE:  [name] is a young female appearing her  chronologic age lying in a supine position in her hospital bed with no  abnormal involuntary movements.  [name] is alert, her eye contact is intermittent.  Initially she looks  away and talks staring at the wall office opposite the undersigned and  then as the conversation proceeds she maintains good eye contact.  Her  attention span is mildly decreased, concentration mildly decreased.  Her  affect is profoundly constricted.  Her mood is depressed.  She is  oriented to all spheres.  Her memory is intact to immediate, recent and  remote.  Her fund of knowledge and intelligence are normal.  Her speech  involves  normal rate, soft volume, the prosody is normal, there is no  dysarthria.  Thought process is logical, coherent, goal-directed, no  looseness of associations.  Thought content:  She acknowledges suicidal  intent.  She has no thoughts of harming others.  She has no  hallucinations.   Her insight is partial.  Her judgment is impaired.   ASSESSMENT:  AXIS I:  1. 293.83 mood disorder not otherwise specified.  2. Rule out major depressive disorder, recurrent, severe.  AXIS II:  Deferred.  AXIS III:  See past medical history.  AXIS IV:  Occupational primary support group.  AXIS V:  30.   [name] is at risk for suicide.   RECOMMENDATIONS:  1. Would continue to provide the sitter for suicide precautions.  2. Will defer psychotropic medications at this time.  3. Would continue to provide ego support.  4. Once cleared from a general medical perspective would admit to an      inpatient psychiatric unit for further evaluation and treatment.   The undersigned provided extensive ego supportive psychotherapy and  education.      Antonietta Breach, M.D.  Electronically Signed     JW/MEDQ  D:  03/29/2009  T:  03/29/2009  Job:  [id number]

## 2011-04-20 NOTE — Consult Note (Signed)
[name], [name] NO.:  [id number]   MEDICAL RECORD NO.:  [id number]          PATIENT TYPE:  INP   LOCATION:  5532                         FACILITY:  [facility]   PHYSICIAN:  Antonietta Breach, M.D.  DATE OF BIRTH:  02/24/1986   DATE OF CONSULTATION:  03/31/2009  DATE OF DISCHARGE:  03/31/2009                                 CONSULTATION   INPATIENT CONSULTATION FOLLOWUP   [name] has improved her mood, has now returned to about 80% of normal.  She does have constructive future goals and interests.  She does not  have any thoughts of harming herself or others.  She is looking forward  to having the baby.  She does not have any hallucinations or delusions.   LABORATORY DATA:  Sodium 133, BUN 4, creatinine 0.73, glucose 93.   PHYSICAL EXAMINATION:  VITAL SIGNS:  Temperature 99.7, pulse 76,  respiratory rate 20, blood pressure 106/64, O2 saturation on room air  99%.   MENTAL STATUS EXAM:  [name] is alert.  She is oriented to all spheres.  Her memory is intact to immediate, recent, and remote.  Her affect is  broad and appropriate.  Mood is within normal limits.  Speech is normal.  Thought process is logical, coherent, goal-directed, and no looseness of  association in thought content.  No thoughts of harming herself or  others.  No delusions or hallucinations.   She has received support during her hospitalization and has had a chance  to ventilate constructively in conversation.  Her insight is intact.  Her judgment is intact.   ASSESSMENT:  293.83:  Mood disorder, not otherwise specified, depressed,  improved, versus adjustment disorder with depressed mood.   [name] is not at risk to harm herself or others.  She agrees to call  Emergency Services immediately for any thoughts of harming herself,  thoughts of harming others, or distress.   She now declines inpatient psychiatric hospitalization, and she is no  longer committable now that she has had time  for support in the  hospital.   She would like to proceed with an intensive outpatient psychiatric  program.   Would ask the social worker to arrange one of these at the first week of  discharge.   These programs [facility] be found at [facility] or [facility].      Antonietta Breach, M.D.  Electronically Signed     JW/MEDQ  D:  04/20/2009  T:  04/20/2009  Job:  [id number]

## 2011-04-20 NOTE — Discharge Summary (Signed)
NAMETAGEN, BRETHAUER NO.:  [id number]   MEDICAL RECORD NO.:  [id number]          PATIENT TYPE:  INP   LOCATION:  2105                         FACILITY:  [facility]   PHYSICIAN:  Santiago Bumpers. Hensel, M.D.DATE OF BIRTH:  02/20/1986   DATE OF ADMISSION:  03/28/2009  DATE OF DISCHARGE:                               DISCHARGE SUMMARY   DISCHARGE MEDICATIONS:  Prenatal vitamin.   DISCHARGE DIAGNOSES:  1. Salicylate overdose, intentional.  2. Single intrauterine pregnancy, estimated gestational age of [redacted]      weeks and 4 days.  3. Mood disorder, not otherwise specified.   CONSULTANT:  1. Critical Care Medicine.  2. OB/GYN, Dr. Penne Lash.  3. Psychiatry, Dr. Jeanie Sewer.   LABORATORY DATA:  The patient's aspirin level on admission was 25.1,  trended up to a high of 53.3, then trended down to 49.6, then 23.30,  then 16.0 prior to discharge.  The patient's UA was negative.  UDS was  negative.  Tricyclics negative.  Alcoholic level less than 5.  Acetaminophen level less than 10.  Beta HCG positive x2 urine and blood.  INR 1.1 on admission.  Sodium 137, potassium 3.4, chloride 111, bicarb  16, BUN 7, creatinine 0.73, glucose 130.  On admission labs, urine pH  6.5 on admission.   STUDIES:  1. A bedside ultrasound with single intrauterine pregnancy with      cardiac activity and fetal motion noted on March 28, 2009.  2. Transvaginal ultrasound on March 29, 2009, with single intrauterine      pregnancy, estimated gestational age [redacted] weeks 4 days biparietal      diameter which is 1-week off of the estimated gestational age by      LMP of 13 weeks 4 days, normal fluid volume.   BRIEF HOSPITAL COURSE:  This is an 25 year old female with medical  history significant for first trimester pregnancy, history of prior  suicide attempt with intentional overdose, who presents status post  intentional aspirin overdose.   1. Salicylate overdose/toxicity.  The patient presented with  aspirin      level at 25.1, trended up to 53.3.  Renal Service was consulted and      given the aspirin levels never exceeding 80-100, there was no need      for dialysis in this patient.  Toxicology was consulted via Poison      Control and were actively following the patient to the initial      stages of her presentation.  The patient was aggressively      alkalinized with bicarbonate effusion and the patient's ABGs and      blood chemistries were followed.  The patient initially presented      with a bicarb of 16.  This improved to 25 upon discharge.  The      patient's aspirin level continued to decline and thus after three      consecutive declining aspirin levels, aspirin levels were no longer      checked.  The patient's bicarbonate normalized as above.  Patient      with ABGs which  showed some mild respiratory alkalosis with a pH of      7.497, CO2 of 29.0 and O2 of 92.0 with a bicarbonate level of 24.      The patient on admission had received two rounds of activated      charcoal in the ED.  With normalization of salicylate levels, the      patient's bicarb drip was stopped and the patient's diet was      advanced.  The patient was tolerating p.o. intake, afebrile and      vital signs stable prior to discharge.  2. Pregnancy.  Patient with an intrauterine pregnancy at 13-4/7 weeks      via last menstrual period.  Cardiac activity was noted on      transvaginal and transabdominal ultrasounds.  The patient will be      discharged on prenatal vitamin.  The patient states that she would      like to live for her unborn child and for her child at home.  3. Psychiatry.  The patient seen by Dr. Jeanie Sewer of psychiatry.      Recommendation made for inpatient psychiatry.  The patient with 24-      hour a day sitter given suicidal attempt.  However, the patient      denied suicidal ideation and homicidal ideation during the course      of her hospitalization.  No medications were started  at this time,      however, recommendation was made for inpatient psych and thus      patient will be transferred to psychiatry facility on discharge.   FOLLOW UP:  The patient will be transferred to inpatient psychiatry.  She will follow up with her primary care physician upon discharge.      Bobby Rumpf, MD  Electronically Signed      Santiago Bumpers. Leveda Anna, M.D.  Electronically Signed    KC/MEDQ  D:  03/29/2009  T:  03/29/2009  Job:  [id number]

## 2011-04-23 NOTE — Discharge Summary (Signed)
[name], [name]                 ACCOUNT NO.:  [id number]   MEDICAL RECORD NO.:  [id number]          PATIENT TYPE:  INP   LOCATION:  9110                          FACILITY:  [facility]   PHYSICIAN:  Nani Gasser, M.D.DATE OF BIRTH:  [birth year]   DATE OF ADMISSION:  12/29/2004  DATE OF DISCHARGE:  01/01/2005                                 DISCHARGE SUMMARY   DISCHARGE DIAGNOSES:  1.  Spontaneous vaginal delivery of a single infant.  2.  Postpartum anemia.  3.  Breastfeeding mother.  4.  Group B Strep positive.   DISCHARGE MEDICATIONS:  1.  Ibuprofen 800 mg one p.o. t.i.d. p.r.n.  2.  Depo-Medrol given at discharge.  3.  Prenatal vitamins one p.o. daily.   DISCHARGE INSTRUCTIONS:  Per routine.   FOLLOW UP:  With [facility] in six weeks.   HOSPITAL COURSE:  [name] is a 25 year old female who is a gravida 1, para  0, who presented at 37-3/7 weeks by her best due date of January 16, 2005,  by initial ultrasound at 7 weeks 3 days.  She came in for contractions that  began early that morning.  Initially the patient was just monitored and then  rechecked.  She was admitted on January 24, in the active phase of labor.  Penicillin was started for Group B Strep positive cultures.  Fetal scalp  electrode and IUPC were placed and amnioinfusion was started secondary to  some variables.  The patient tolerated this well.  At 44, [name]  delivered a female infant vaginally after pushing for about two hours.  The  infant was suctioned at the perineum.  There was no nuchal cord and the  infant was delivered and handed to the staff.  The placenta was delivered  intact.  She did have a first degree laceration at 1 o'clock, 10 o'clock,  and 6 o'clock.  There was one vessel which was bleeding and was tied off.  There were no complications.  Estimated blood loss was about 300 mL.  Mother  and infant were stable and mother began to breast feed immediately after  delivery.   ASSESSMENT:  1.  Spontaneous vaginal delivery of a female infant, both were discharged home      in stable condition and the infant was circumcised.  2.  Postpartum anemia, hemoglobin was 9.6.  The patient was encouraged to      continue her prenatal vitamins especially while breastfeeding.  3.  Breastfeeding.  Encouraged her to continue to do so at discharge.     CM/MEDQ  D:  02/03/2005  T:  02/04/2005  Job:  [id number]

## 2011-09-02 ENCOUNTER — Inpatient Hospital Stay (INDEPENDENT_AMBULATORY_CARE_PROVIDER_SITE_OTHER)
Admission: RE | Admit: 2011-09-02 | Discharge: 2011-09-02 | Disposition: A | Discharge status: Home / Self Care | Payer: Self-pay | Source: Ambulatory Visit | Attending: Family Medicine | Admitting: Family Medicine

## 2011-09-02 DIAGNOSIS — M722 Plantar fascial fibromatosis: Secondary | ICD-10-CM

## 2011-09-02 LAB — POCT URINALYSIS DIP (DEVICE)
Glucose, UA: NEGATIVE mg/dL
Hgb urine dipstick: NEGATIVE
Specific Gravity, Urine: 1.02 (ref 1.005–1.030)
Urobilinogen, UA: 0.2 mg/dL (ref 0.0–1.0)
pH: 7.5 (ref 5.0–8.0)

## 2011-09-17 ENCOUNTER — Encounter (HOSPITAL_COMMUNITY): Payer: Self-pay | Admitting: *Deleted

## 2011-09-17 ENCOUNTER — Inpatient Hospital Stay (HOSPITAL_COMMUNITY)
Admission: AD | Admit: 2011-09-17 | Discharge: 2011-09-17 | Disposition: A | Discharge status: Home / Self Care | Payer: Medicaid Other | Source: Ambulatory Visit | Attending: Obstetrics & Gynecology | Admitting: Obstetrics & Gynecology

## 2011-09-17 ENCOUNTER — Inpatient Hospital Stay (HOSPITAL_COMMUNITY): Payer: Medicaid Other

## 2011-09-17 DIAGNOSIS — R109 Unspecified abdominal pain: Secondary | ICD-10-CM | POA: Insufficient documentation

## 2011-09-17 DIAGNOSIS — O26899 Other specified pregnancy related conditions, unspecified trimester: Secondary | ICD-10-CM

## 2011-09-17 DIAGNOSIS — O99891 Other specified diseases and conditions complicating pregnancy: Secondary | ICD-10-CM | POA: Insufficient documentation

## 2011-09-17 HISTORY — DX: Urinary tract infection, site not specified: N39.0

## 2011-09-17 HISTORY — DX: Unspecified ovarian cyst, unspecified side: N83.209

## 2011-09-17 LAB — CBC
HCT: 34.8 % — ABNORMAL LOW (ref 36.0–46.0)
Hemoglobin: 11.5 g/dL — ABNORMAL LOW (ref 12.0–15.0)
MCHC: 33 g/dL (ref 30.0–36.0)
WBC: 10 10*3/uL (ref 4.0–10.5)

## 2011-09-17 LAB — URINALYSIS, ROUTINE W REFLEX MICROSCOPIC
Glucose, UA: NEGATIVE mg/dL
Protein, ur: NEGATIVE mg/dL

## 2011-09-17 LAB — WET PREP, GENITAL: Yeast Wet Prep HPF POC: NONE SEEN

## 2011-09-17 LAB — POCT PREGNANCY, URINE: Preg Test, Ur: POSITIVE

## 2011-09-17 NOTE — Progress Notes (Signed)
Pt states she started having lower abdominal pain yesterday, getting worse today. Denies any bleeding, nausea or vomiting. Is two days late for her period.

## 2011-09-17 NOTE — ED Provider Notes (Signed)
History     Chief Complaint  Patient presents with  . Abdominal Pain   HPI  Pt states she started having bilateral lower pelvic pain that started yesterday, getting worse today. Pain is described as "crampy".  Denies any bleeding, nausea or vomiting.   Two days late for her period. +home pregnancy test.         Past Medical History  Diagnosis Date  . Urinary tract infection   . Ovarian cyst     History reviewed. No pertinent past surgical history.  Family History  Problem Relation Age of Onset  . Hypertension Mother   . Hypertension Father   . Diabetes Father     History  Substance Use Topics  . Smoking status: Former Smoker    Types: Cigarettes  . Smokeless tobacco: Never Used  . Alcohol Use: No    Allergies: No Known Allergies  Prescriptions prior to admission  Medication Sig Dispense Refill  . Doxylamine Succinate, Sleep, (UNISOM) 25 MG tablet Take 25 mg by mouth. Every 6-8 hours       . lansoprazole (PREVACID 24HR) 15 MG capsule Take 15 mg by mouth daily.        . Prenatal Multivit-Min-Fe-FA (PRENATAL VITAMINS) 0.8 MG tablet Take 1 tablet by mouth daily.        . Pyridoxine HCl (VITAMIN B-6) 25 MG tablet Take 25 mg by mouth every 6 (six) hours.        . ranitidine (ZANTAC) 150 MG capsule Take 150 mg by mouth 2 (two) times daily.          Review of Systems  Gastrointestinal: Positive for abdominal pain.  Musculoskeletal: Positive for back pain.  All other systems reviewed and are negative.   Physical Exam   Blood pressure 128/81, pulse 97, temperature 99.9 F (37.7 C), temperature source Oral, resp. rate 16, height 5\' 8"  (1.727 m), weight 97.705 kg (215 lb 6.4 oz), last menstrual period 08/16/2011, SpO2 98.00%.  Physical Exam  Constitutional: She is oriented to person, place, and time. She appears well-developed and well-nourished. No distress.  HENT:  Head: Normocephalic.  Neck: Normal range of motion. Neck supple.  Cardiovascular: Normal rate,  regular rhythm and normal heart sounds.  Exam reveals no gallop and no friction rub.   No murmur heard. Respiratory: Effort normal and breath sounds normal. No respiratory distress.  GI: She exhibits no mass. There is no tenderness. There is no rebound, no guarding and no CVA tenderness.  Genitourinary: Cervix exhibits no motion tenderness and no discharge. Vaginal discharge (white, creamy) found.  Musculoskeletal: Normal range of motion.  Neurological: She is alert and oriented to person, place, and time.  Skin: Skin is warm and dry.  Psychiatric: She has a normal mood and affect.    MAU Course  Procedures  Wet prep - few clue GC/CT - pend BHCG - 764 Hgb - 11.5 [name] - Clinical Data: Vaginal bleeding with pain. Positive pregnancy  test.  OBSTETRIC <14 WK [name] AND TRANSVAGINAL OB US  Technique: Both transabdominal and transvaginal ultrasound  examinations were performed for complete evaluation of the  gestation as well as the maternal uterus, adnexal regions, and  pelvic cul-de-sac. Transvaginal technique was performed to assess  early pregnancy.  Comparison: None.  Intrauterine gestational sac: Not visualized  Yolk sac: Not visualized  Embryo: Not visualized the  Cardiac Activity: N/A  Maternal uterus/adnexae:  The maternal right ovary is 2.2 x 1.7 x 2.1 cm. The left ovary  measures  2.9 x 3.3 x 2.4 cm. The small follicle is identified  within the left ovarian parenchyma. There is no free fluid in the  cul-de-sac.  IMPRESSION:  No intrauterine gestational sac. Given the history of a positive  pregnancy test, differential considerations include intrauterine  gestation too early to visualize, completed abortion, or  nonvisualized ectopic pregnancy. Close clinical correlation is  recommended with serial beta-hCG and followup ultrasound as  warranted.   Report given to Nolene Bernheim who assumes care of pt. [facility] 09/17/2011, 8:49 PM   Assessment and Plan  Abdominal  pain in early pregnancy  Plan Return on Monday between 8 am and 12 noon for repeat quant Do not put anything in the vagina - no tampons, no douching. Do not have sexual intercourse. Do not lift more than 5 pounds. Return if you develop sudden, worse abdominal pain. Return if you have heavy vaginal bleeding.   Nolene Bernheim, NP 09/17/11 2220

## 2011-09-17 NOTE — Progress Notes (Signed)
Pain started yesterday- off and on , cramping like period is going to start.  Hands were swelling last night and were tingly.

## 2011-09-18 LAB — GC/CHLAMYDIA PROBE AMP, GENITAL: Chlamydia, DNA Probe: NEGATIVE

## 2011-09-20 ENCOUNTER — Inpatient Hospital Stay (HOSPITAL_COMMUNITY)
Admission: AD | Admit: 2011-09-20 | Discharge: 2011-09-20 | Disposition: A | Discharge status: Home / Self Care | Payer: Medicaid Other | Source: Ambulatory Visit | Attending: Obstetrics & Gynecology | Admitting: Obstetrics & Gynecology

## 2011-09-20 DIAGNOSIS — O26899 Other specified pregnancy related conditions, unspecified trimester: Secondary | ICD-10-CM

## 2011-09-20 DIAGNOSIS — R109 Unspecified abdominal pain: Secondary | ICD-10-CM

## 2011-09-20 DIAGNOSIS — O99891 Other specified diseases and conditions complicating pregnancy: Secondary | ICD-10-CM | POA: Insufficient documentation

## 2011-09-20 NOTE — ED Provider Notes (Signed)
Attestation of Attending Supervision of Advanced Practitioner: Evaluation and management procedures were performed by the PA/NP/CNM/OB Fellow under my supervision/collaboration. Chart reviewed and agree with management and plan.  Miriya Cloer A 09/20/2011 12:06 PM

## 2011-09-20 NOTE — Progress Notes (Signed)
Pt to MAU for follow up BHCG. Pt denies any pain or bleeding. Pt does state that she feels a little pressure on the LLQ.

## 2011-09-20 NOTE — ED Provider Notes (Signed)
History     Chief Complaint  Patient presents with  . Follow-up   HPI Here for repeat quant. Seen on 10/12 with pelvic pain, quant 764, u/s showed no IUP, no adnexal mass. Pain has resolved, no vaginal bleeding.   OB History    Grav Para Term Preterm Abortions TAB SAB Ect Mult Living   3 2 2  0 0 0 0 0 0 2      Past Medical History  Diagnosis Date  . Urinary tract infection   . Ovarian cyst     No past surgical history on file.  Family History  Problem Relation Age of Onset  . Hypertension Mother   . Hypertension Father   . Diabetes Father     History  Substance Use Topics  . Smoking status: Former Smoker    Types: Cigarettes  . Smokeless tobacco: Never Used  . Alcohol Use: No    Allergies: No Known Allergies  Prescriptions prior to admission  Medication Sig Dispense Refill  . Doxylamine Succinate, Sleep, (UNISOM) 25 MG tablet Take 25 mg by mouth. Every 6-8 hours      . ibuprofen (ADVIL,MOTRIN) 200 MG tablet Take 400-600 mg by mouth every 6 (six) hours as needed. For pain       . Prenatal Multivit-Min-Fe-FA (PRENATAL VITAMINS) 0.8 MG tablet Take 1 tablet by mouth daily.        . Pyridoxine HCl (VITAMIN B-6) 25 MG tablet Take 25 mg by mouth every 6 (six) hours.        . ranitidine (ZANTAC) 150 MG capsule Take 150 mg by mouth 2 (two) times daily.          Review of Systems  Constitutional: Negative.   Gastrointestinal: Negative.   Genitourinary: Negative.    Physical Exam   Blood pressure 125/62, pulse 88, temperature 99.4 F (37.4 C), temperature source Oral, resp. rate 16, last menstrual period 08/16/2011, SpO2 97.00%, not currently breastfeeding.  Physical Exam  Constitutional: She is oriented to person, place, and time. She appears well-developed and well-nourished. No distress.  Cardiovascular: Normal rate.   Respiratory: Effort normal.  Musculoskeletal: Normal range of motion.  Neurological: She is alert and oriented to person, place, and time.    Psychiatric: She has a normal mood and affect.    MAU Course  Procedures  Results for orders placed during the hospital encounter of 09/20/11 (from the past 24 hour(s))  HCG, QUANTITATIVE, PREGNANCY     Status: Abnormal   Collection Time   09/20/11  8:24 AM      Component Value Range   hCG, Beta Chain, Quant, S 2711 (*) <5 (mIU/mL)     Assessment and Plan  Appropriate rise in HCG Return 1 week for repeat u/s Precautions rev'd  Jacarri Gesner 09/20/2011, 9:09 AM

## 2011-09-21 LAB — CBC
HCT: 36.4
Hemoglobin: 12.4
MCHC: 34
MCV: 84.5
Platelets: 228
RBC: 4.31
RDW: 12.1
WBC: 5.6

## 2011-09-21 LAB — DIFFERENTIAL
Basophils Absolute: 0
Basophils Relative: 0
Eosinophils Absolute: 0
Lymphocytes Relative: 34
Lymphs Abs: 1.9
Monocytes Absolute: 0.3
Monocytes Relative: 5
Neutro Abs: 3.4
Neutrophils Relative %: 60

## 2011-09-21 LAB — BASIC METABOLIC PANEL WITH GFR
BUN: 7
CO2: 27
Calcium: 8.3 — ABNORMAL LOW
Chloride: 108
Creatinine, Ser: 0.91
GFR calc non Af Amer: >60
Glucose, Bld: 117 — ABNORMAL HIGH
Potassium: 3.2 — ABNORMAL LOW
Sodium: 143

## 2011-09-21 LAB — RAPID URINE DRUG SCREEN, HOSP PERFORMED
Amphetamines: NOT DETECTED
Barbiturates: NOT DETECTED
Benzodiazepines: NOT DETECTED
Cocaine: NOT DETECTED
Opiates: NOT DETECTED
Tetrahydrocannabinol: POSITIVE — AB

## 2011-09-21 LAB — POCT PREGNANCY, URINE
Operator id: 29727
Preg Test, Ur: NEGATIVE

## 2011-09-27 ENCOUNTER — Ambulatory Visit (HOSPITAL_COMMUNITY)
Admission: RE | Admit: 2011-09-27 | Discharge: 2011-09-27 | Disposition: A | Discharge status: Home / Self Care | Payer: Medicaid Other | Source: Ambulatory Visit | Attending: Advanced Practice Midwife | Admitting: Advanced Practice Midwife

## 2011-09-27 ENCOUNTER — Ambulatory Visit (HOSPITAL_COMMUNITY): Payer: Self-pay

## 2011-09-27 DIAGNOSIS — R109 Unspecified abdominal pain: Secondary | ICD-10-CM | POA: Insufficient documentation

## 2011-09-27 DIAGNOSIS — O99891 Other specified diseases and conditions complicating pregnancy: Secondary | ICD-10-CM | POA: Insufficient documentation

## 2011-09-27 DIAGNOSIS — Z3689 Encounter for other specified antenatal screening: Secondary | ICD-10-CM | POA: Insufficient documentation

## 2011-10-26 ENCOUNTER — Inpatient Hospital Stay (HOSPITAL_COMMUNITY)
Admission: AD | Admit: 2011-10-26 | Discharge: 2011-10-26 | Disposition: A | Discharge status: Home / Self Care | Payer: Medicaid Other | Source: Ambulatory Visit | Attending: Obstetrics & Gynecology | Admitting: Obstetrics & Gynecology

## 2011-10-26 ENCOUNTER — Encounter (HOSPITAL_COMMUNITY): Payer: Self-pay

## 2011-10-26 ENCOUNTER — Inpatient Hospital Stay (HOSPITAL_COMMUNITY): Payer: Medicaid Other

## 2011-10-26 DIAGNOSIS — R109 Unspecified abdominal pain: Secondary | ICD-10-CM | POA: Insufficient documentation

## 2011-10-26 DIAGNOSIS — R42 Dizziness and giddiness: Secondary | ICD-10-CM | POA: Insufficient documentation

## 2011-10-26 DIAGNOSIS — O99891 Other specified diseases and conditions complicating pregnancy: Secondary | ICD-10-CM | POA: Insufficient documentation

## 2011-10-26 DIAGNOSIS — O26899 Other specified pregnancy related conditions, unspecified trimester: Secondary | ICD-10-CM

## 2011-10-26 LAB — URINALYSIS, ROUTINE W REFLEX MICROSCOPIC
Bilirubin Urine: NEGATIVE
Ketones, ur: NEGATIVE mg/dL
Leukocytes, UA: NEGATIVE
Nitrite: NEGATIVE
Protein, ur: NEGATIVE mg/dL

## 2011-10-26 NOTE — Progress Notes (Signed)
Patient states she has been having pain on her right side waist level for a couple of weeks. Has some "pulling" sensation in the lower abdomen off and on. No bleeding or discharge.

## 2011-10-26 NOTE — Progress Notes (Signed)
Has been having a lot of nausea, occasional vomiting.

## 2011-10-26 NOTE — Progress Notes (Signed)
Pt states she is having pain on her left side feels crampy and right side feels like"it  Has been bruised." Pt states right side feels sore

## 2011-11-10 ENCOUNTER — Other Ambulatory Visit: Payer: Self-pay

## 2011-11-12 ENCOUNTER — Other Ambulatory Visit: Payer: Medicaid Other

## 2011-11-12 DIAGNOSIS — Z331 Pregnant state, incidental: Secondary | ICD-10-CM

## 2011-11-12 NOTE — Progress Notes (Signed)
Prenatal labs [facility] Rochanda Harpham

## 2011-11-13 LAB — OBSTETRIC PANEL
Antibody Screen: NEGATIVE
Basophils Relative: 0 % (ref 0–1)
HCT: 33 % — ABNORMAL LOW (ref 36.0–46.0)
Hemoglobin: 10.9 g/dL — ABNORMAL LOW (ref 12.0–15.0)
Lymphs Abs: 1.7 10*3/uL (ref 0.7–4.0)
MCH: 28.3 pg (ref 26.0–34.0)
MCHC: 33 g/dL (ref 30.0–36.0)
Monocytes Absolute: 0.3 10*3/uL (ref 0.1–1.0)
Monocytes Relative: 4 % (ref 3–12)
Neutro Abs: 5.7 10*3/uL (ref 1.7–7.7)
RBC: 3.85 MIL/uL — ABNORMAL LOW (ref 3.87–5.11)
Rh Type: POSITIVE

## 2011-11-15 LAB — CULTURE, OB URINE: Colony Count: 30000

## 2011-11-16 ENCOUNTER — Ambulatory Visit: Payer: Medicaid Other | Admitting: Emergency Medicine

## 2011-11-16 VITALS — BP 114/72 | Temp 98.7°F | Wt 224.6 lb

## 2011-11-16 DIAGNOSIS — Z331 Pregnant state, incidental: Secondary | ICD-10-CM

## 2011-11-16 DIAGNOSIS — Z01419 Encounter for gynecological examination (general) (routine) without abnormal findings: Secondary | ICD-10-CM | POA: Insufficient documentation

## 2011-11-16 MED ORDER — FERROUS SULFATE 325 (65 FE) MG PO TABS: 325.0000 mg | ORAL_TABLET | Freq: Every day | ORAL | Status: DC

## 2011-11-16 NOTE — Progress Notes (Signed)
S: Patient reports doing well.  Unplanned pregnancy while using condoms for contraception.  FOB involved.  No problems with previous pregnancies.  Does have some nausea and occasional vomiting.  This is improving.  Eating a varied diet.  Does feels some abdominal "dragging," but no pain.  Describes pink-tinged discharge following intercourse, but no frank bleeding or vaginal discharge.  O:  Vitals: reviews Abdomen: soft, non-tender Pelvic: normal external genitalia, normal vagina, normal cervix, cervix closed and thick, small amount of thin white discharge present; bimanual with uterus palpated midway between pubic bone and umbilicus; mild pain with palpation of right adnexa, no masses. FHT: 165 by doppler  A/P: 25 year old G3P2002 at 13.[redacted] weeks gestation by LMP/1st trimester [name] -given father with diabetes and borderline GDM with prior pregnancies will order 1hr GTT -discussed weight gain of 15-25lbs -offered quad screen - patient declined -offered flu vaccine - patient declined -will discuss birth control options later in pregnancy -reviewed pre-natal labs: will need Abx during L&D for GBS, slightly anemic, will give iron supplement -follow up in 4 weeks for routine OB care

## 2011-11-16 NOTE — Patient Instructions (Signed)
I have placed a order for a 1 hour glucola test - make a lab appointment in the next 2 weeks to have this done.  Please follow up for routine OB care in 4 weeks.  Pregnancy - Second Trimester The second trimester of pregnancy (3 to 6 months) is a period of rapid growth for you and your baby. At the end of the sixth month, your baby is about 9 inches long and weighs 1 1/2 pounds. You will begin to feel the baby move between 18 and 20 weeks of the pregnancy. This is called quickening. Weight gain is faster. A clear fluid (colostrum) may leak out of your breasts. You may feel small contractions of the womb (uterus). This is known as false labor or Braxton-Hicks contractions. This is like a practice for labor when the baby is ready to be born. Usually, the problems with morning sickness have usually passed by the end of your first trimester. Some women develop small dark blotches (called cholasma, mask of pregnancy) on their face that usually goes away after the baby is born. Exposure to the sun makes the blotches worse. Acne may also develop in some pregnant women and pregnant women who have acne, may find that it goes away. PRENATAL EXAMS  Blood work may continue to be done during prenatal exams. These tests are done to check on your health and the probable health of your baby. Blood work is used to follow your blood levels (hemoglobin). Anemia (low hemoglobin) is common during pregnancy. Iron and vitamins are given to help prevent this. You will also be checked for diabetes between 24 and 28 weeks of the pregnancy. Some of the previous blood tests may be repeated.   The size of the uterus is measured during each visit. This is to make sure that the baby is continuing to grow properly according to the dates of the pregnancy.   Your blood pressure is checked every prenatal visit. This is to make sure you are not getting toxemia.   Your urine is checked to make sure you do not have an infection, diabetes  or protein in the urine.   Your weight is checked often to make sure gains are happening at the suggested rate. This is to ensure that both you and your baby are growing normally.   Sometimes, an ultrasound is performed to confirm the proper growth and development of the baby. This is a test which bounces harmless sound waves off the baby so your caregiver can more accurately determine due dates.  Sometimes, a specialized test is done on the amniotic fluid surrounding the baby. This test is called an amniocentesis. The amniotic fluid is obtained by sticking a needle into the belly (abdomen). This is done to check the chromosomes in instances where there is a concern about possible genetic problems with the baby. It is also sometimes done near the end of pregnancy if an early delivery is required. In this case, it is done to help make sure the baby's lungs are mature enough for the baby to live outside of the womb. CHANGES OCCURING IN THE SECOND TRIMESTER OF PREGNANCY Your body goes through many changes during pregnancy. They vary from person to person. Talk to your caregiver about changes you notice that you are concerned about.  During the second trimester, you will likely have an increase in your appetite. It is normal to have cravings for certain foods. This varies from person to person and pregnancy to pregnancy.  Your lower abdomen will begin to bulge.   You may have to urinate more often because the uterus and baby are pressing on your bladder. It is also common to get more bladder infections during pregnancy (pain with urination). You can help this by drinking lots of fluids and emptying your bladder before and after intercourse.   You may begin to get stretch marks on your hips, abdomen, and breasts. These are normal changes in the body during pregnancy. There are no exercises or medications to take that prevent this change.   You may begin to develop swollen and bulging veins (varicose  veins) in your legs. Wearing support hose, elevating your feet for 15 minutes, 3 to 4 times a day and limiting salt in your diet helps lessen the problem.   Heartburn may develop as the uterus grows and pushes up against the stomach. Antacids recommended by your caregiver helps with this problem. Also, eating smaller meals 4 to 5 times a day helps.   Constipation can be treated with a stool softener or adding bulk to your diet. Drinking lots of fluids, vegetables, fruits, and whole grains are helpful.   Exercising is also helpful. If you have been very active up until your pregnancy, most of these activities can be continued during your pregnancy. If you have been less active, it is helpful to start an exercise program such as walking.   Hemorrhoids (varicose veins in the rectum) may develop at the end of the second trimester. Warm sitz baths and hemorrhoid cream recommended by your caregiver helps hemorrhoid problems.   Backaches may develop during this time of your pregnancy. Avoid heavy lifting, wear low heal shoes and practice good posture to help with backache problems.   Some pregnant women develop tingling and numbness of their hand and fingers because of swelling and tightening of ligaments in the wrist (carpel tunnel syndrome). This goes away after the baby is born.   As your breasts enlarge, you may have to get a bigger bra. Get a comfortable, cotton, support bra. Do not get a nursing bra until the last month of the pregnancy if you will be nursing the baby.   You may get a dark line from your belly button to the pubic area called the linea nigra.   You may develop rosy cheeks because of increase blood flow to the face.   You may develop spider looking lines of the face, neck, arms and chest. These go away after the baby is born.  HOME CARE INSTRUCTIONS   It is extremely important to avoid all smoking, herbs, alcohol, and unprescribed drugs during your pregnancy. These chemicals  affect the formation and growth of the baby. Avoid these chemicals throughout the pregnancy to ensure the delivery of a healthy infant.   Most of your home care instructions are the same as suggested for the first trimester of your pregnancy. Keep your caregiver's appointments. Follow your caregiver's instructions regarding medication use, exercise and diet.   During pregnancy, you are providing food for you and your baby. Continue to eat regular, well-balanced meals. Choose foods such as meat, fish, milk and other low fat dairy products, vegetables, fruits, and whole-grain breads and cereals. Your caregiver will tell you of the ideal weight gain.   A physical sexual relationship may be continued up until near the end of pregnancy if there are no other problems. Problems could include early (premature) leaking of amniotic fluid from the membranes, vaginal bleeding, abdominal pain, or other  medical or pregnancy problems.   Exercise regularly if there are no restrictions. Check with your caregiver if you are unsure of the safety of some of your exercises. The greatest weight gain will occur in the last 2 trimesters of pregnancy. Exercise will help you:   Control your weight.   Get you in shape for labor and delivery.   Lose weight after you have the baby.   Wear a good support or jogging bra for breast tenderness during pregnancy. This may help if worn during sleep. Pads or tissues may be used in the bra if you are leaking colostrum.   Do not use hot tubs, steam rooms or saunas throughout the pregnancy.   Wear your seat belt at all times when driving. This protects you and your baby if you are in an accident.   Avoid raw meat, uncooked cheese, cat litter boxes and soil used by cats. These carry germs that can cause birth defects in the baby.   The second trimester is also a good time to visit your dentist for your dental health if this has not been done yet. Getting your teeth cleaned is OK.  Use a soft toothbrush. Brush gently during pregnancy.   It is easier to loose urine during pregnancy. Tightening up and strengthening the pelvic muscles will help with this problem. Practice stopping your urination while you are going to the bathroom. These are the same muscles you need to strengthen. It is also the muscles you would use as if you were trying to stop from passing gas. You can practice tightening these muscles up 10 times a set and repeating this about 3 times per day. Once you know what muscles to tighten up, do not perform these exercises during urination. It is more likely to contribute to an infection by backing up the urine.   Ask for help if you have financial, counseling or nutritional needs during pregnancy. Your caregiver will be able to offer counseling for these needs as well as refer you for other special needs.   Your skin may become oily. If so, wash your face with mild soap, use non-greasy moisturizer and oil or cream based makeup.  MEDICATIONS AND DRUG USE IN PREGNANCY  Take prenatal vitamins as directed. The vitamin should contain 1 milligram of folic acid. Keep all vitamins out of reach of children. Only a couple vitamins or tablets containing iron may be fatal to a baby or young child when ingested.   Avoid use of all medications, including herbs, over-the-counter medications, not prescribed or suggested by your caregiver. Only take over-the-counter or prescription medicines for pain, discomfort, or fever as directed by your caregiver. Do not use aspirin.   Let your caregiver also know about herbs you may be using.   Alcohol is related to a number of birth defects. This includes fetal alcohol syndrome. All alcohol, in any form, should be avoided completely. Smoking will cause low birth rate and premature babies.   Street or illegal drugs are very harmful to the baby. They are absolutely forbidden. A baby born to an addicted mother will be addicted at birth. The  baby will go through the same withdrawal an adult does.  SEEK MEDICAL CARE IF:  You have any concerns or worries during your pregnancy. It is better to call with your questions if you feel they cannot wait, rather than worry about them. SEEK IMMEDIATE MEDICAL CARE IF:   An unexplained oral temperature above 102 F (38.9 C) develops,  or as your caregiver suggests.   You have leaking of fluid from the vagina (birth canal). If leaking membranes are suspected, take your temperature and tell your caregiver of this when you call.   There is vaginal spotting, bleeding, or passing clots. Tell your caregiver of the amount and how many pads are used. Light spotting in pregnancy is common, especially following intercourse.   You develop a bad smelling vaginal discharge with a change in the color from clear to white.   You continue to feel sick to your stomach (nauseated) and have no relief from remedies suggested. You vomit blood or coffee ground-like materials.   You lose more than 2 pounds of weight or gain more than 2 pounds of weight over 1 week, or as suggested by your caregiver.   You notice swelling of your face, hands, feet, or legs.   You get exposed to [place] measles and have never had them.   You are exposed to fifth disease or chickenpox.   You develop belly (abdominal) pain. Round ligament discomfort is a common non-cancerous (benign) cause of abdominal pain in pregnancy. Your caregiver still must evaluate you.   You develop a bad headache that does not go away.   You develop fever, diarrhea, pain with urination, or shortness of breath.   You develop visual problems, blurry, or double vision.   You fall or are in a car accident or any kind of trauma.   There is mental or physical violence at home.  Document Released: 11/16/2001 Document Revised: 08/04/2011 Document Reviewed: 05/21/2009 [facility] Patient Information 2012 [place], [place].

## 2011-11-17 ENCOUNTER — Other Ambulatory Visit (HOSPITAL_COMMUNITY)
Admission: RE | Admit: 2011-11-17 | Discharge: 2011-11-17 | Disposition: A | Discharge status: Home / Self Care | Payer: Self-pay | Source: Ambulatory Visit | Attending: Emergency Medicine | Admitting: Emergency Medicine

## 2011-11-17 LAB — GC/CHLAMYDIA PROBE AMP, GENITAL: GC Probe Amp, Genital: NEGATIVE

## 2011-12-07 NOTE — L&D Delivery Note (Signed)
Delivery Note At 4:10 PM a viable and healthy female was delivered via Vaginal, Spontaneous Delivery (Presentation: ; Occiput Anterior).  APGAR: 9, 9; weight .   Placenta status: Intact, Spontaneous.  Cord: 3 vessels with the following complications: None.  Cord pH: n/a  Anesthesia: None  Episiotomy: None Lacerations: None Suture Repair: n/a Est. Blood Loss (mL): 300  Mom to postpartum.  Baby to nursery-stable.  [facility] 05/14/2012, 5:00 PM

## 2011-12-08 ENCOUNTER — Other Ambulatory Visit: Payer: Medicaid Other

## 2011-12-15 ENCOUNTER — Encounter (HOSPITAL_COMMUNITY): Payer: Self-pay | Admitting: *Deleted

## 2011-12-15 ENCOUNTER — Inpatient Hospital Stay (HOSPITAL_COMMUNITY)
Admission: AD | Admit: 2011-12-15 | Discharge: 2011-12-15 | Disposition: A | Discharge status: Home / Self Care | Payer: Medicaid Other | Source: Ambulatory Visit | Attending: Obstetrics & Gynecology | Admitting: Obstetrics & Gynecology

## 2011-12-15 DIAGNOSIS — N949 Unspecified condition associated with female genital organs and menstrual cycle: Secondary | ICD-10-CM | POA: Insufficient documentation

## 2011-12-15 DIAGNOSIS — N390 Urinary tract infection, site not specified: Secondary | ICD-10-CM | POA: Insufficient documentation

## 2011-12-15 DIAGNOSIS — O2390 Unspecified genitourinary tract infection in pregnancy, unspecified trimester: Secondary | ICD-10-CM | POA: Insufficient documentation

## 2011-12-15 DIAGNOSIS — O2340 Unspecified infection of urinary tract in pregnancy, unspecified trimester: Secondary | ICD-10-CM

## 2011-12-15 DIAGNOSIS — R109 Unspecified abdominal pain: Secondary | ICD-10-CM | POA: Insufficient documentation

## 2011-12-15 HISTORY — DX: Depression, unspecified: F32.A

## 2011-12-15 HISTORY — DX: Anemia, unspecified: D64.9

## 2011-12-15 HISTORY — DX: Chlamydial infection, unspecified: A74.9

## 2011-12-15 HISTORY — DX: Major depressive disorder, single episode, unspecified: F32.9

## 2011-12-15 LAB — URINALYSIS, ROUTINE W REFLEX MICROSCOPIC
Glucose, UA: NEGATIVE mg/dL
Ketones, ur: 15 mg/dL — AB
Leukocytes, UA: NEGATIVE
Nitrite: POSITIVE — AB
Specific Gravity, Urine: >1.03 — ABNORMAL HIGH (ref 1.005–1.030)
pH: 6 (ref 5.0–8.0)

## 2011-12-15 LAB — URINE MICROSCOPIC-ADD ON

## 2011-12-15 MED ORDER — NITROFURANTOIN MONOHYD MACRO 100 MG PO CAPS: 100.0000 mg | ORAL_CAPSULE | Freq: Two times a day (BID) | ORAL | Status: AC

## 2011-12-15 NOTE — ED Notes (Signed)
Pt was unaware of anemia or that rx for iron was called in, explained importance

## 2011-12-15 NOTE — Progress Notes (Signed)
Patient states she has been having pelvic pain since 12-26 that feels like bones grinding. Getting worse since yesterday. No bleeding.

## 2011-12-15 NOTE — Progress Notes (Signed)
End of Dec, pelvic pressure and "feels like something is grinding in there", comes and goes, until yesterday- now is constant.  Hurts to walk

## 2011-12-15 NOTE — ED Provider Notes (Signed)
History    G3P2 at 17 weeks presents with lower abdominal and pelvic pain x2 weeks described as constant and sharp. Denies LOF, vaginal bleeding, abnormal vaginal discharge, itching or burning.  Denies urinary frequency, urgency, or pain upon urination.  Reports feeling fetal movement.   Chief Complaint  Patient presents with  . Pelvic Pain   HPI  OB History    Grav Para Term Preterm Abortions TAB SAB Ect Mult Living   3 2 2  0 0 0 0 0 0 2      Past Medical History  Diagnosis Date  . Urinary tract infection   . Ovarian cyst   . Anemia   . Depression     with 2nd preg  . Chlamydia     Past Surgical History  Procedure Date  . No past surgeries     Family History  Problem Relation Age of Onset  . Hypertension Mother   . Hypertension Father   . Diabetes Father   . Anesthesia problems Neg Hx     History  Substance Use Topics  . Smoking status: Former Smoker    Types: Cigarettes  . Smokeless tobacco: Never Used  . Alcohol Use: No    Allergies: No Known Allergies  Prescriptions prior to admission  Medication Sig Dispense Refill  . Prenatal Multivit-Min-Fe-FA (PRENATAL VITAMINS) 0.8 MG tablet Take 1 tablet by mouth daily.       Marland Kitchen pyridOXINE (VITAMIN B-6) 25 MG tablet Take 25 mg by mouth daily.      . ferrous sulfate (FERROUSUL) 325 (65 FE) MG tablet Take 1 tablet (325 mg total) by mouth daily with breakfast.  30 tablet  11    Review of Systems  Constitutional: Negative.   HENT: Negative.   Eyes: Negative.   Respiratory: Negative.   Cardiovascular: Negative.   Gastrointestinal: Negative.   Genitourinary: Negative.   Musculoskeletal: Negative.   Skin: Negative.   Neurological: Negative.   Endo/Heme/Allergies: Negative.   Psychiatric/Behavioral: Negative.    Physical Exam   Blood pressure 139/58, pulse 92, temperature 98.6 F (37 C), temperature source Oral, resp. rate 20, height 5\' 7"  (1.702 m), weight 101.606 kg (224 lb), last menstrual period  08/16/2011, SpO2 98.00%.  Physical Exam  Constitutional: She is oriented to person, place, and time. She appears well-developed and well-nourished.  HENT:  Head: Normocephalic.  Neck: Normal range of motion.  Respiratory: Effort normal.  GI: Soft.  Genitourinary:       Cervix 0/long/hi  Musculoskeletal: Normal range of motion.  Neurological: She is alert and oriented to person, place, and time.  Skin: Skin is warm and dry.  Psychiatric: She has a normal mood and affect. Her behavior is normal. Judgment and thought content normal.    MAU Course  Procedures Cervical exam U/A   Assessment and Plan  A: UTI in pregnancy  P:  D/C home  Macrobid 100 mg BID for 7 days Return to MAU if symptoms do not resolve or worsen  LEFTWICH-KIRBY, Raia Amico 12/15/2011, 6:22 PM

## 2011-12-16 ENCOUNTER — Ambulatory Visit: Payer: Medicaid Other | Admitting: Emergency Medicine

## 2011-12-16 NOTE — Progress Notes (Signed)
Pt here today for routine OB follow up.  She is doing well with no concerns today.  Was seen at [facility] for a UTI yesterday and started on Macrobid.  States symptoms have improved some since starting the medication last night.  Nausea and vomiting has resolved.  Reports fetal movement.  Denies vaginal bleeding, discharge.  O: Fundus: just below umbilicus FHT: 65  A/P: 26 year old G3P2002 at 77.3 -encouraged getting 1 hour glucola done -anatomy scan ordered -will obtain test of cure urine at next appointment -follow up in 4 weeks

## 2011-12-16 NOTE — Patient Instructions (Signed)
It was nice to see you again.  We will schedule an ultrasound before you leave today for the anatomy scan between 18-[redacted] weeks pregnant.  Please make a lab appointment to do the 1 hour glucose test.  I will see you back in 4 weeks for routine monitoring of pregnancy.  Pregnancy - Second Trimester The second trimester of pregnancy (3 to 6 months) is a period of rapid growth for you and your baby. At the end of the sixth month, your baby is about 9 inches long and weighs 1 1/2 pounds. You will begin to feel the baby move between 18 and 20 weeks of the pregnancy. This is called quickening. Weight gain is faster. A clear fluid (colostrum) may leak out of your breasts. You may feel small contractions of the womb (uterus). This is known as false labor or Braxton-Hicks contractions. This is like a practice for labor when the baby is ready to be born. Usually, the problems with morning sickness have usually passed by the end of your first trimester. Some women develop small dark blotches (called cholasma, mask of pregnancy) on their face that usually goes away after the baby is born. Exposure to the sun makes the blotches worse. Acne may also develop in some pregnant women and pregnant women who have acne, may find that it goes away. PRENATAL EXAMS  Blood work may continue to be done during prenatal exams. These tests are done to check on your health and the probable health of your baby. Blood work is used to follow your blood levels (hemoglobin). Anemia (low hemoglobin) is common during pregnancy. Iron and vitamins are given to help prevent this. You will also be checked for diabetes between 24 and 28 weeks of the pregnancy. Some of the previous blood tests may be repeated.   The size of the uterus is measured during each visit. This is to make sure that the baby is continuing to grow properly according to the dates of the pregnancy.   Your blood pressure is checked every prenatal visit. This is to make sure  you are not getting toxemia.   Your urine is checked to make sure you do not have an infection, diabetes or protein in the urine.   Your weight is checked often to make sure gains are happening at the suggested rate. This is to ensure that both you and your baby are growing normally.   Sometimes, an ultrasound is performed to confirm the proper growth and development of the baby. This is a test which bounces harmless sound waves off the baby so your caregiver can more accurately determine due dates.  Sometimes, a specialized test is done on the amniotic fluid surrounding the baby. This test is called an amniocentesis. The amniotic fluid is obtained by sticking a needle into the belly (abdomen). This is done to check the chromosomes in instances where there is a concern about possible genetic problems with the baby. It is also sometimes done near the end of pregnancy if an early delivery is required. In this case, it is done to help make sure the baby's lungs are mature enough for the baby to live outside of the womb. CHANGES OCCURING IN THE SECOND TRIMESTER OF PREGNANCY Your body goes through many changes during pregnancy. They vary from person to person. Talk to your caregiver about changes you notice that you are concerned about.  During the second trimester, you will likely have an increase in your appetite. It is normal to have  cravings for certain foods. This varies from person to person and pregnancy to pregnancy.   Your lower abdomen will begin to bulge.   You may have to urinate more often because the uterus and baby are pressing on your bladder. It is also common to get more bladder infections during pregnancy (pain with urination). You can help this by drinking lots of fluids and emptying your bladder before and after intercourse.   You may begin to get stretch marks on your hips, abdomen, and breasts. These are normal changes in the body during pregnancy. There are no exercises or  medications to take that prevent this change.   You may begin to develop swollen and bulging veins (varicose veins) in your legs. Wearing support hose, elevating your feet for 15 minutes, 3 to 4 times a day and limiting salt in your diet helps lessen the problem.   Heartburn may develop as the uterus grows and pushes up against the stomach. Antacids recommended by your caregiver helps with this problem. Also, eating smaller meals 4 to 5 times a day helps.   Constipation can be treated with a stool softener or adding bulk to your diet. Drinking lots of fluids, vegetables, fruits, and whole grains are helpful.   Exercising is also helpful. If you have been very active up until your pregnancy, most of these activities can be continued during your pregnancy. If you have been less active, it is helpful to start an exercise program such as walking.   Hemorrhoids (varicose veins in the rectum) may develop at the end of the second trimester. Warm sitz baths and hemorrhoid cream recommended by your caregiver helps hemorrhoid problems.   Backaches may develop during this time of your pregnancy. Avoid heavy lifting, wear low heal shoes and practice good posture to help with backache problems.   Some pregnant women develop tingling and numbness of their hand and fingers because of swelling and tightening of ligaments in the wrist (carpel tunnel syndrome). This goes away after the baby is born.   As your breasts enlarge, you may have to get a bigger bra. Get a comfortable, cotton, support bra. Do not get a nursing bra until the last month of the pregnancy if you will be nursing the baby.   You may get a dark line from your belly button to the pubic area called the linea nigra.   You may develop rosy cheeks because of increase blood flow to the face.   You may develop spider looking lines of the face, neck, arms and chest. These go away after the baby is born.  HOME CARE INSTRUCTIONS   It is extremely  important to avoid all smoking, herbs, alcohol, and unprescribed drugs during your pregnancy. These chemicals affect the formation and growth of the baby. Avoid these chemicals throughout the pregnancy to ensure the delivery of a healthy infant.   Most of your home care instructions are the same as suggested for the first trimester of your pregnancy. Keep your caregiver's appointments. Follow your caregiver's instructions regarding medication use, exercise and diet.   During pregnancy, you are providing food for you and your baby. Continue to eat regular, well-balanced meals. Choose foods such as meat, fish, milk and other low fat dairy products, vegetables, fruits, and whole-grain breads and cereals. Your caregiver will tell you of the ideal weight gain.   A physical sexual relationship may be continued up until near the end of pregnancy if there are no other problems. Problems could  include early (premature) leaking of amniotic fluid from the membranes, vaginal bleeding, abdominal pain, or other medical or pregnancy problems.   Exercise regularly if there are no restrictions. Check with your caregiver if you are unsure of the safety of some of your exercises. The greatest weight gain will occur in the last 2 trimesters of pregnancy. Exercise will help you:   Control your weight.   Get you in shape for labor and delivery.   Lose weight after you have the baby.   Wear a good support or jogging bra for breast tenderness during pregnancy. This may help if worn during sleep. Pads or tissues may be used in the bra if you are leaking colostrum.   Do not use hot tubs, steam rooms or saunas throughout the pregnancy.   Wear your seat belt at all times when driving. This protects you and your baby if you are in an accident.   Avoid raw meat, uncooked cheese, cat litter boxes and soil used by cats. These carry germs that can cause birth defects in the baby.   The second trimester is also a good time to  visit your dentist for your dental health if this has not been done yet. Getting your teeth cleaned is OK. Use a soft toothbrush. Brush gently during pregnancy.   It is easier to loose urine during pregnancy. Tightening up and strengthening the pelvic muscles will help with this problem. Practice stopping your urination while you are going to the bathroom. These are the same muscles you need to strengthen. It is also the muscles you would use as if you were trying to stop from passing gas. You can practice tightening these muscles up 10 times a set and repeating this about 3 times per day. Once you know what muscles to tighten up, do not perform these exercises during urination. It is more likely to contribute to an infection by backing up the urine.   Ask for help if you have financial, counseling or nutritional needs during pregnancy. Your caregiver will be able to offer counseling for these needs as well as refer you for other special needs.   Your skin may become oily. If so, wash your face with mild soap, use non-greasy moisturizer and oil or cream based makeup.  MEDICATIONS AND DRUG USE IN PREGNANCY  Take prenatal vitamins as directed. The vitamin should contain 1 milligram of folic acid. Keep all vitamins out of reach of children. Only a couple vitamins or tablets containing iron may be fatal to a baby or young child when ingested.   Avoid use of all medications, including herbs, over-the-counter medications, not prescribed or suggested by your caregiver. Only take over-the-counter or prescription medicines for pain, discomfort, or fever as directed by your caregiver. Do not use aspirin.   Let your caregiver also know about herbs you may be using.   Alcohol is related to a number of birth defects. This includes fetal alcohol syndrome. All alcohol, in any form, should be avoided completely. Smoking will cause low birth rate and premature babies.   Street or illegal drugs are very harmful to  the baby. They are absolutely forbidden. A baby born to an addicted mother will be addicted at birth. The baby will go through the same withdrawal an adult does.  SEEK MEDICAL CARE IF:  You have any concerns or worries during your pregnancy. It is better to call with your questions if you feel they cannot wait, rather than worry about them. SEEK  IMMEDIATE MEDICAL CARE IF:   An unexplained oral temperature above 102 F (38.9 C) develops, or as your caregiver suggests.   You have leaking of fluid from the vagina (birth canal). If leaking membranes are suspected, take your temperature and tell your caregiver of this when you call.   There is vaginal spotting, bleeding, or passing clots. Tell your caregiver of the amount and how many pads are used. Light spotting in pregnancy is common, especially following intercourse.   You develop a bad smelling vaginal discharge with a change in the color from clear to white.   You continue to feel sick to your stomach (nauseated) and have no relief from remedies suggested. You vomit blood or coffee ground-like materials.   You lose more than 2 pounds of weight or gain more than 2 pounds of weight over 1 week, or as suggested by your caregiver.   You notice swelling of your face, hands, feet, or legs.   You get exposed to [place] measles and have never had them.   You are exposed to fifth disease or chickenpox.   You develop belly (abdominal) pain. Round ligament discomfort is a common non-cancerous (benign) cause of abdominal pain in pregnancy. Your caregiver still must evaluate you.   You develop a bad headache that does not go away.   You develop fever, diarrhea, pain with urination, or shortness of breath.   You develop visual problems, blurry, or double vision.   You fall or are in a car accident or any kind of trauma.   There is mental or physical violence at home.  Document Released: 11/16/2001 Document Revised: 08/04/2011 Document Reviewed:  05/21/2009 [facility] Patient Information 2012 [place], [place].

## 2011-12-17 ENCOUNTER — Telehealth: Payer: Self-pay | Admitting: Emergency Medicine

## 2011-12-17 ENCOUNTER — Telehealth: Payer: Self-pay | Admitting: *Deleted

## 2011-12-17 NOTE — Telephone Encounter (Signed)
Called pt. Scheduled appt at [facility] for [name] 12-24-11 at 2pm. .[name]

## 2011-12-17 NOTE — Telephone Encounter (Signed)
[facility] [name] [facility] - they told that we would have to schedule it.

## 2011-12-17 NOTE — Telephone Encounter (Signed)
Received form with patient requesting PCP change.  Reason for request on form:  "States that she doesn't feel comfortable with Elwyn Reach.  This is her 3rd pregnancy and feels like Elwyn Reach doesn't know what she is doing."  Need more info.  Called patient and left message for her to call back.  Gaylene Brooks, RN

## 2011-12-20 NOTE — Telephone Encounter (Signed)
Called patient again to get more info about request for PCP change.  Patient states that she

## 2011-12-20 NOTE — Telephone Encounter (Signed)
I called and spoke with the patient regarding her concerns and changing PCPs.  She expressed concern that I do not have a lot of prenatal experience and appeared uncomfortable with parts of the exam.  She had a similar situation with the resident caring for her during her first pregnancy, and she expressed some regret that she did not ask for a different physician then as she was uncomfortable with how prenatal care and delivery went.  I provided reassurance that we had check lists, I regularly met with an attending physician to go over her prenatal care, and did have experience with labor and delivery.  I also expressed appreciation that she was willing to provide feedback to help my learning process.  At this time, she would like to switch to either a 2nd or 3rd year resident who has more experience with prenatal care.

## 2011-12-20 NOTE — Telephone Encounter (Signed)
Patient states that she "doesn't feel comfortable with the doctor."  Was seen on 12/16/11 and had some weight loss.  States she was told by Dr. Elwyn Reach that "her weight looked good."  Dr. Elwyn Reach was unable to find heartbeat with doppler and said "she would try to find heartbeat at next visit." Patient states that Dr. Elwyn Reach incorrectly told her to schedule her own ultrasound appt at [facility].  She wants a doctor that "has more experience" due to her having pregnancy complications.  She does not want to change facilities.  Explained PCP request change protocol---will have Dr. Elwyn Reach talk with patient first to clarify any issues before considering PCP change.  I will follow up with her after she has talked with Dr. Elwyn Reach.  Patient verbalized understanding and is ok with this.  Will route note to Dr. Elwyn Reach.   Gaylene Brooks, RN

## 2011-12-21 NOTE — Telephone Encounter (Signed)
Returned call to patient.  Prefers to have female MD.  Will change PCP to Dr. Fara Boros.  OB appt given for 01/12/12 @ 4:00 pm.  Gaylene Brooks, RN

## 2011-12-24 ENCOUNTER — Ambulatory Visit (HOSPITAL_COMMUNITY)
Admission: RE | Admit: 2011-12-24 | Discharge: 2011-12-24 | Disposition: A | Discharge status: Home / Self Care | Payer: Medicaid Other | Source: Ambulatory Visit | Attending: Family Medicine | Admitting: Family Medicine

## 2011-12-24 DIAGNOSIS — O358XX0 Maternal care for other (suspected) fetal abnormality and damage, not applicable or unspecified: Secondary | ICD-10-CM | POA: Insufficient documentation

## 2011-12-24 DIAGNOSIS — Z1389 Encounter for screening for other disorder: Secondary | ICD-10-CM | POA: Insufficient documentation

## 2011-12-24 DIAGNOSIS — Z363 Encounter for antenatal screening for malformations: Secondary | ICD-10-CM | POA: Insufficient documentation

## 2012-01-10 ENCOUNTER — Encounter (HOSPITAL_COMMUNITY): Payer: Self-pay | Admitting: *Deleted

## 2012-01-10 ENCOUNTER — Inpatient Hospital Stay (HOSPITAL_COMMUNITY)
Admission: AD | Admit: 2012-01-10 | Discharge: 2012-01-10 | Disposition: A | Discharge status: Home / Self Care | Payer: Medicaid Other | Source: Ambulatory Visit | Attending: Family Medicine | Admitting: Family Medicine

## 2012-01-10 DIAGNOSIS — N760 Acute vaginitis: Secondary | ICD-10-CM | POA: Insufficient documentation

## 2012-01-10 DIAGNOSIS — O2390 Unspecified genitourinary tract infection in pregnancy, unspecified trimester: Secondary | ICD-10-CM | POA: Insufficient documentation

## 2012-01-10 DIAGNOSIS — B9689 Other specified bacterial agents as the cause of diseases classified elsewhere: Secondary | ICD-10-CM | POA: Insufficient documentation

## 2012-01-10 DIAGNOSIS — R109 Unspecified abdominal pain: Secondary | ICD-10-CM | POA: Insufficient documentation

## 2012-01-10 DIAGNOSIS — A499 Bacterial infection, unspecified: Secondary | ICD-10-CM

## 2012-01-10 DIAGNOSIS — K219 Gastro-esophageal reflux disease without esophagitis: Secondary | ICD-10-CM | POA: Insufficient documentation

## 2012-01-10 DIAGNOSIS — N949 Unspecified condition associated with female genital organs and menstrual cycle: Secondary | ICD-10-CM

## 2012-01-10 LAB — URINE MICROSCOPIC-ADD ON

## 2012-01-10 LAB — URINALYSIS, ROUTINE W REFLEX MICROSCOPIC
Bilirubin Urine: NEGATIVE
Ketones, ur: NEGATIVE mg/dL
Leukocytes, UA: NEGATIVE
Nitrite: NEGATIVE
Protein, ur: NEGATIVE mg/dL
Urobilinogen, UA: 0.2 mg/dL (ref 0.0–1.0)

## 2012-01-10 LAB — WET PREP, GENITAL

## 2012-01-10 MED ORDER — METRONIDAZOLE 500 MG PO TABS: 500.0000 mg | ORAL_TABLET | Freq: Two times a day (BID) | ORAL | Status: AC

## 2012-01-10 MED ORDER — GI COCKTAIL ~~LOC~~
30.0000 mL | ORAL | Status: AC
  Administered 2012-01-10: 30 mL via ORAL
  Filled 2012-01-10: qty 30

## 2012-01-10 MED ORDER — RANITIDINE HCL 150 MG PO TABS: 150.0000 mg | ORAL_TABLET | Freq: Two times a day (BID) | ORAL | Status: DC

## 2012-01-10 NOTE — Progress Notes (Signed)
Pt in c/o lower abdominal sharp pains that come and go since 0700.  Worse with standing and moving.  Also reports upper abdominal burning since this morning.  Denies any bleeding, leaking of fluid, or discharge.  + FM.

## 2012-01-10 NOTE — Progress Notes (Signed)
Patient state she has had lower abdominal pain for a while off and on. Today is having a burning sensation in the mid abdomen and lower abdominal cramping and pressure. Has had three loose bowel movements, no nausea or vomiting. Denies any leaking or bleeding and has felt the baby move today. Low back pain today.

## 2012-01-10 NOTE — ED Provider Notes (Signed)
Chart reviewed and agree with management and plan.  

## 2012-01-10 NOTE — ED Provider Notes (Signed)
History    G3P2002 at [redacted]w[redacted]d presents to MAU for sharp pain in lower abdomen, reported as lasting less than 30 seconds and located in inguinal areas bilaterally and burning pain in upper abdomen.   She reports standing all day at her job and lower abdominal pain worsens with standing. She reports feeling fetal movement, denies LOF, vaginal discharge, or urinary symptoms.  She was diagnosed 2 weeks ago with UTI and completed abx treatment.   Chief Complaint  Patient presents with  . Abdominal Pain   HPI  OB History    Grav Para Term Preterm Abortions TAB SAB Ect Mult Living   3 2 2  0 0 0 0 0 0 2      Past Medical History  Diagnosis Date  . Urinary tract infection   . Ovarian cyst   . Anemia   . Depression     with 2nd preg  . Chlamydia     Past Surgical History  Procedure Date  . No past surgeries     Family History  Problem Relation Age of Onset  . Hypertension Mother   . Hypertension Father   . Diabetes Father   . Anesthesia problems Neg Hx     History  Substance Use Topics  . Smoking status: Former Smoker    Types: Cigarettes  . Smokeless tobacco: Never Used  . Alcohol Use: No    Allergies: No Known Allergies  Prescriptions prior to admission  Medication Sig Dispense Refill  . Prenatal Vit-Fe Fumarate-FA (PRENATAL MULTIVITAMIN) TABS Take 1 tablet by mouth daily.      Marland Kitchen pyridOXINE (VITAMIN B-6) 25 MG tablet Take 25 mg by mouth daily.        Review of Systems  Constitutional: Negative for fever, chills and malaise/fatigue.  Eyes: Negative for blurred vision and double vision.  Respiratory: Negative for cough and shortness of breath.   Gastrointestinal: Positive for heartburn and abdominal pain. Negative for nausea and vomiting.  Genitourinary: Negative for dysuria, urgency and frequency.  Neurological: Negative for dizziness and headaches.  Psychiatric/Behavioral: Negative.    Physical Exam   Blood pressure 119/59, pulse 92, temperature 98.5 F (36.9  C), temperature source Oral, resp. rate 16, height 5\' 8"  (1.727 m), weight 100.971 kg (222 lb 9.6 oz), last menstrual period 08/16/2011, SpO2 99.00%.  Physical Exam  Constitutional: She is oriented to person, place, and time. She appears well-developed.  Neck: Normal range of motion.  Respiratory: Effort normal.  GI: Soft.  Musculoskeletal: Normal range of motion.  Neurological: She is alert and oriented to person, place, and time. She has normal reflexes.  Skin: Skin is warm and dry.  Psychiatric: She has a normal mood and affect. Her behavior is normal. Judgment and thought content normal.   Results for orders placed during the hospital encounter of 01/10/12 (from the past 24 hour(s))  URINALYSIS, ROUTINE W REFLEX MICROSCOPIC     Status: Abnormal   Collection Time   01/10/12 12:05 PM      Component Value Range   Color, Urine YELLOW  YELLOW    APPearance CLEAR  CLEAR    Specific Gravity, Urine 1.025  1.005 - 1.030    pH 6.0  5.0 - 8.0    Glucose, UA NEGATIVE  NEGATIVE (mg/dL)   Hgb urine dipstick TRACE (*) NEGATIVE    Bilirubin Urine NEGATIVE  NEGATIVE    Ketones, ur NEGATIVE  NEGATIVE (mg/dL)   Protein, ur NEGATIVE  NEGATIVE (mg/dL)   Urobilinogen, UA 0.2  0.0 - 1.0 (mg/dL)   Nitrite NEGATIVE  NEGATIVE    Leukocytes, UA NEGATIVE  NEGATIVE   URINE MICROSCOPIC-ADD ON     Status: Abnormal   Collection Time   01/10/12 12:05 PM      Component Value Range   Squamous Epithelial / LPF FEW (*) RARE    WBC, UA 0-2  <3 (WBC/hpf)   RBC / HPF 0-2  <3 (RBC/hpf)   Bacteria, UA MANY (*) RARE    Urine-Other MUCOUS PRESENT    WET PREP, GENITAL     Status: Abnormal   Collection Time   01/10/12  2:27 PM      Component Value Range   Yeast Wet Prep HPF POC NONE SEEN  NONE SEEN    Trich, Wet Prep NONE SEEN  NONE SEEN    Clue Cells Wet Prep HPF POC MODERATE (*) NONE SEEN    WBC, Wet Prep HPF POC MODERATE (*) NONE SEEN    MAU Course  Procedures U/a Pelvic exam with wet prep,  gc/chlamydia  MDM GI cocktail given in MAU--pt reports burning abdominal pain relieved.  Assessment and Plan  A: Round ligament pain Acid reflux of pregnancy Bacterial vaginosis  P: D/C home Prescription for Zantac 150 mg BID, Flagyl 500 mg BID for 7 days Discussed use of pregnancy support band, especially when standing for long periods of time  LEFTWICH-KIRBY, Mercedees Convery 01/10/2012, 1:05 PM

## 2012-01-11 ENCOUNTER — Encounter: Payer: Self-pay | Admitting: Family Medicine

## 2012-01-11 DIAGNOSIS — Z3480 Encounter for supervision of other normal pregnancy, unspecified trimester: Secondary | ICD-10-CM | POA: Insufficient documentation

## 2012-01-11 DIAGNOSIS — Z22330 Carrier of Group B streptococcus: Secondary | ICD-10-CM | POA: Insufficient documentation

## 2012-01-11 LAB — GC/CHLAMYDIA PROBE AMP, GENITAL: Chlamydia, DNA Probe: NEGATIVE

## 2012-01-12 ENCOUNTER — Encounter: Payer: Medicaid Other | Admitting: Family Medicine

## 2012-01-31 ENCOUNTER — Encounter (INDEPENDENT_AMBULATORY_CARE_PROVIDER_SITE_OTHER): Payer: Medicaid Other | Admitting: Family Medicine

## 2012-01-31 ENCOUNTER — Ambulatory Visit (INDEPENDENT_AMBULATORY_CARE_PROVIDER_SITE_OTHER): Payer: Self-pay | Admitting: Family Medicine

## 2012-01-31 DIAGNOSIS — Z3480 Encounter for supervision of other normal pregnancy, unspecified trimester: Secondary | ICD-10-CM

## 2012-01-31 NOTE — Progress Notes (Signed)
CC: nasal congestion x 3 days HPI:  3 days of nasal congestion, fatigue.  No fever, chills dyspnea, or sig cough. PE: GEN: Alert & Oriented, No acute distress HEENT: no conjunctival injection or scleral icterus.  Bilateral tympanic membranes intact with mild erythema  .  Nares with edema or rhinorrhea.  Oropharynx is without erythema or exudates.   A/P 26 yo G3P2002 @ 24.0 weeks.  With viral URI Supportive care, advised against use of OTC cold medicines. Discussed lapse in prenatal care with patient.  She does not feel this is a priority at this time and is reluctant to schedule a follow-up appt for prenatal care.  CV:  Regular Rate & Rhythm, no murmur Respiratory:  Normal work of breathing, CTAB

## 2012-01-31 NOTE — Patient Instructions (Signed)
Use nasal saline spray to help with congestion OK to take tylenol Don't take over the counter cold medicines in pregnancy  ----------------------------------------------------------------------------------------------------------------------------------------------------  Upper Respiratory Infection, Adult An upper respiratory infection (URI) is also sometimes known as the common cold. The upper respiratory tract includes the nose, sinuses, throat, trachea, and bronchi. Bronchi are the airways leading to the lungs. Most people improve within 1 week, but symptoms can last up to 2 weeks. A residual cough may last even longer.   CAUSES Many different viruses can infect the tissues lining the upper respiratory tract. The tissues become irritated and inflamed and often become very moist. Mucus production is also common. A cold is contagious. You can easily spread the virus to others by oral contact. This includes kissing, sharing a glass, coughing, or sneezing. Touching your mouth or nose and then touching a surface, which is then touched by another person, can also spread the virus. SYMPTOMS   Symptoms typically develop 1 to 3 days after you come in contact with a cold virus. Symptoms vary from person to person. They may include:  Runny nose.     Sneezing.    Nasal congestion.     Sinus irritation.     Sore throat.     Loss of voice (laryngitis).     Cough.    Fatigue.    Muscle aches.     Loss of appetite.     Headache.    Low-grade fever.  DIAGNOSIS   You might diagnose your own cold based on familiar symptoms, since most people get a cold 2 to 3 times a year. Your caregiver can confirm this based on your exam. Most importantly, your caregiver can check that your symptoms are not due to another disease such as strep throat, sinusitis, pneumonia, asthma, or epiglottitis. Blood tests, throat tests, and X-rays are not necessary to diagnose a common cold, but they may sometimes be  helpful in excluding other more serious diseases. Your caregiver will decide if any further tests are required. RISKS AND COMPLICATIONS   You may be at risk for a more severe case of the common cold if you smoke cigarettes, have chronic heart disease (such as heart failure) or lung disease (such as asthma), or if you have a weakened immune system. The very young and very old are also at risk for more serious infections. Bacterial sinusitis, middle ear infections, and bacterial pneumonia can complicate the common cold. The common cold can worsen asthma and chronic obstructive pulmonary disease (COPD). Sometimes, these complications can require emergency medical care and may be life-threatening. PREVENTION   The best way to protect against getting a cold is to practice good hygiene. Avoid oral or hand contact with people with cold symptoms. Wash your hands often if contact occurs. There is no clear evidence that vitamin C, vitamin E, echinacea, or exercise reduces the chance of developing a cold. However, it is always recommended to get plenty of rest and practice good nutrition. TREATMENT   Treatment is directed at relieving symptoms. There is no cure. Antibiotics are not effective, because the infection is caused by a virus, not by bacteria. Treatment may include:  Increased fluid intake. Sports drinks offer valuable electrolytes, sugars, and fluids.     Breathing heated mist or steam (vaporizer or shower).     Eating chicken soup or other clear broths, and maintaining good nutrition.     Getting plenty of rest.     Using gargles or lozenges for comfort.  Controlling fevers with ibuprofen or acetaminophen as directed by your caregiver.     Increasing usage of your inhaler if you have asthma.  Zinc gel and zinc lozenges, taken in the first 24 hours of the common cold, can shorten the duration and lessen the severity of symptoms. Pain medicines may help with fever, muscle aches, and throat  pain. A variety of non-prescription medicines are available to treat congestion and runny nose. Your caregiver can make recommendations and may suggest nasal or lung inhalers for other symptoms.   HOME CARE INSTRUCTIONS    Only take over-the-counter or prescription medicines for pain, discomfort, or fever as directed by your caregiver.     Use a warm mist humidifier or inhale steam from a shower to increase air moisture. This may keep secretions moist and make it easier to breathe.     Drink enough water and fluids to keep your urine clear or pale yellow.     Rest as needed.     Return to work when your temperature has returned to normal or as your caregiver advises. You may need to stay home longer to avoid infecting others. You can also use a face mask and careful hand washing to prevent spread of the virus.  SEEK MEDICAL CARE IF:    After the first few days, you feel you are getting worse rather than better.     You need your caregiver's advice about medicines to control symptoms.     You develop chills, worsening shortness of breath, or brown or red sputum. These may be signs of pneumonia.     You develop yellow or brown nasal discharge or pain in the face, especially when you bend forward. These may be signs of sinusitis.     You develop a fever, swollen neck glands, pain with swallowing, or white areas in the back of your throat. These may be signs of strep throat.  SEEK IMMEDIATE MEDICAL CARE IF:    You have a fever.     You develop severe or persistent headache, ear pain, sinus pain, or chest pain.     You develop wheezing, a prolonged cough, cough up blood, or have a change in your usual mucus (if you have chronic lung disease).     You develop sore muscles or a stiff neck.  Document Released: 05/18/2001 Document Revised: 08/04/2011 Document Reviewed: 03/26/2011 [facility] Patient Information 2012 [place], [place].

## 2012-02-01 NOTE — Progress Notes (Signed)
This encounter was created in error - please disregard.

## 2012-02-16 ENCOUNTER — Ambulatory Visit (INDEPENDENT_AMBULATORY_CARE_PROVIDER_SITE_OTHER): Payer: Self-pay | Admitting: Family Medicine

## 2012-02-16 VITALS — BP 120/76 | Wt 228.0 lb

## 2012-02-16 DIAGNOSIS — Z22330 Carrier of Group B streptococcus: Secondary | ICD-10-CM

## 2012-02-16 DIAGNOSIS — Z3480 Encounter for supervision of other normal pregnancy, unspecified trimester: Secondary | ICD-10-CM

## 2012-02-16 MED ORDER — AMOXICILLIN 875 MG PO TABS: 875.0000 mg | ORAL_TABLET | Freq: Two times a day (BID) | ORAL | Status: AC

## 2012-02-16 NOTE — Progress Notes (Signed)
S: 26 y.o. G3P2002 @ [redacted]w[redacted]d  (EDC 05/22/2012, by Last Menstrual Period )  -Doing well, no complaints -Good fetal movement: yes -Concerns include: having small amount of vaginal discharge, clear liquid w/o odor, does not soak through underwear or pants; still having some pelvic discomfort     O:  Filed Vitals:   02/16/12 1558  BP: 120/76   See OB flowsheet GEN: Well-developed female in no acute distress.  ABD: Gravid. Positive fetal movement appreciated.  EXT: no edema.   A/P: 26 y.o. G3P2002 @ [redacted]w[redacted]d  (EDC 05/22/2012, by Last Menstrual Period )  -will treat GBS positive culture  -f/u in 4 weeks with me or OB clinic -f/u sooner for 1hr GTT and 28 wk labs; will also need TOC for GBS

## 2012-02-16 NOTE — Patient Instructions (Signed)
It was great to see you today! Everything with you and baby looks great. PLEASE come back in the next 1-2 weeks to have labs drawn and do your sugar test.  You will need to be here for an hour. I am giving you an antibiotic to take to help clear up the bacteria in your urine.  You will still need antibiotics during labor, but this is supposed to help prevent preterm labor complications. Please take all of the antibiotic.  If you feel like the baby is not moving as much as usual, please do "kick counts." To feel the baby, sit in a quiet place with your feet up, free of distractions. If you feel the baby kick 5 times in 1 hour, you can stop. If not, feel for a second hour. 10 kicks in 2 hours is very reassuring and makes [name] feel that the baby is doing well.  If you would like, you can dry drinking a small amount of caffeine before counting for the 2nd hour to help baby wake up.  If you do not feel 10 kicks in 2 hours while concentrating, please go to the MAU.  If you have regular contractions, increased vaginal or pelvic pressure, bleeding or fluid from your vagina, or you do not feel the baby moving enough, please go to the [facility].  Lab visit in 1-2 weeks, then follow up with me or OB clinic in about 4 weeks.

## 2012-02-16 NOTE — Assessment & Plan Note (Signed)
Pt will return for lab only appt for 1hr GTT and 28 wk labs.  GBS status addressed today.  Good FM.  Some clear vaginal d/c, pt feels like BV was treated and that d/c was different, so wet prep not done today (no smell, no itching or discomfort).  Could consider if continued issue at next visit.  Birth control was not discussed today.  Offered classes at [employer]. Kick counts and PTL discussed.

## 2012-02-16 NOTE — Assessment & Plan Note (Signed)
Has not been treated, will treat today with amoxicillin.  Will need TOC, possibly at lab only visit, otherwise at next visit with me.  Will need abx during labor.

## 2012-03-02 ENCOUNTER — Other Ambulatory Visit: Payer: Medicaid Other

## 2012-03-02 LAB — GLUCOSE, CAPILLARY: Glucose-Capillary: 145 mg/dL — ABNORMAL HIGH (ref 70–99)

## 2012-03-02 NOTE — Progress Notes (Signed)
1 hr gtt, scheduled for 3 hr on 03/23/2012.Kadia Abaya, Rodena Medin

## 2012-03-23 ENCOUNTER — Encounter: Payer: Self-pay | Admitting: Family Medicine

## 2012-03-23 ENCOUNTER — Ambulatory Visit (INDEPENDENT_AMBULATORY_CARE_PROVIDER_SITE_OTHER): Payer: Medicaid Other | Admitting: Family Medicine

## 2012-03-23 ENCOUNTER — Other Ambulatory Visit: Payer: Medicaid Other

## 2012-03-23 DIAGNOSIS — Z331 Pregnant state, incidental: Secondary | ICD-10-CM

## 2012-03-23 DIAGNOSIS — Z3480 Encounter for supervision of other normal pregnancy, unspecified trimester: Secondary | ICD-10-CM

## 2012-03-23 NOTE — Progress Notes (Signed)
PMH Risk Tool completed today; patient screens negative except for questions #8 and #11 (parents had problem with alcohol or drug abuse); (patient has "had difficulties due to alcohol or other drugs, including prescription medications".)  PMH Risk form completed, along with PHQ9, score=1.  Form placed in PMH box in front office.  Paula Compton, MD

## 2012-03-23 NOTE — Progress Notes (Signed)
Patient seen in [facility], G3P2 @[redacted]w[redacted]d .  History GBS+ urine.  Lives on her own with 2 other children, having a son this time.  FOB (not FOB of prior children) is reported to have abducted his children from other women, is stating he wants joint custody for this child and is now living with another woman. Patient states she feels safe, no concerns for physical safety or that of her two children.  Social supports: her father and the aunt of her oldest son.  Failed 1hGTT, is having 3hGTT today.  Describes bilat inguinal pain with laying on sides; no contractions.  Daily FM.  Discussed kick counts and preterm labor precautions.  28 week labs to be done today.  Discussed care for plantar fasciitis, including using soup can to massage plantar surface of feet in morning.  No NSAID or ASA during pregnancy, may use Tylenol.  FOllow up with Dr Fara Boros in 2 weeks.  Paula Compton, MD

## 2012-03-23 NOTE — Progress Notes (Signed)
Addended by: Jennette Bill on: 03/23/2012 09:24 AM   Modules accepted: Orders

## 2012-03-23 NOTE — Progress Notes (Signed)
3 hr gtt done today Signe Tackitt 

## 2012-03-23 NOTE — Patient Instructions (Signed)
It was a pleasure to see you today.  You are 31 weeks and 3 days along.   We will contact you with the results of your glucose test from today.   Follow up with Dr Fara Boros in 2 weeks.   Fetal Movement Counts Patient Name: __________________________________________________ Patient Due Date: ____________________ [name] counts is highly recommended in high risk pregnancies, but it is a good idea for every pregnant woman to do. Start counting fetal movements at 28 weeks of the pregnancy. Fetal movements increase after eating a full meal or eating or drinking something sweet (the blood sugar is higher). It is also important to drink plenty of fluids (well hydrated) before doing the count. Lie on your left side because it helps with the circulation or you can sit in a comfortable chair with your arms over your belly (abdomen) with no distractions around you. DOING THE COUNT  Try to do the count the same time of day each time you do it.   Mark the day and time, then see how long it takes for you to feel 10 movements (kicks, flutters, swishes, rolls). You should have at least 10 movements within 2 hours. You will most likely feel 10 movements in much less than 2 hours. If you do not, wait an hour and count again. After a couple of days you will see a pattern.   What you are looking for is a change in the pattern or not enough counts in 2 hours. Is it taking longer in time to reach 10 movements?  SEEK MEDICAL CARE IF:  You feel less than 10 counts in 2 hours. Tried twice.   No movement in one hour.   The pattern is changing or taking longer each day to reach 10 counts in 2 hours.   You feel the baby is not moving as it usually does.  Date: ____________ Movements: ____________ Start time: ____________ [name] time: ____________  Date: ____________ Movements: ____________ Start time: ____________ [name] time: ____________ Date: ____________ Movements: ____________ Start time: ____________ [name] time:  ____________ Date: ____________ Movements: ____________ Start time: ____________ [name] time: ____________ Date: ____________ Movements: ____________ Start time: ____________ [name] time: ____________ Date: ____________ Movements: ____________ Start time: ____________ [name] time: ____________ Date: ____________ Movements: ____________ Start time: ____________ [name] time: ____________ Date: ____________ Movements: ____________ Start time: ____________ [name] time: ____________  Date: ____________ Movements: ____________ Start time: ____________ [name] time: ____________ Date: ____________ Movements: ____________ Start time: ____________ [name] time: ____________ Date: ____________ Movements: ____________ Start time: ____________ [name] time: ____________ Date: ____________ Movements: ____________ Start time: ____________ [name] time: ____________ Date: ____________ Movements: ____________ Start time: ____________ [name] time: ____________ Date: ____________ Movements: ____________ Start time: ____________ [name] time: ____________ Date: ____________ Movements: ____________ Start time: ____________ [name] time: ____________  Date: ____________ Movements: ____________ Start time: ____________ [name] time: ____________ Date: ____________ Movements: ____________ Start time: ____________ [name] time: ____________ Date: ____________ Movements: ____________ Start time: ____________ [name] time: ____________ Date: ____________ Movements: ____________ Start time: ____________ [name] time: ____________ Date: ____________ Movements: ____________ Start time: ____________ [name] time: ____________ Date: ____________ Movements: ____________ Start time: ____________ [name] time: ____________ Date: ____________ Movements: ____________ Start time: ____________ [name] time: ____________  Date: ____________ Movements: ____________ Start time: ____________ [name] time: ____________ Date: ____________ Movements:  ____________ Start time: ____________ [name] time: ____________ Date: ____________ Movements: ____________ Start time: ____________ [name] time: ____________ Date: ____________ Movements: ____________ Start time: ____________ [name] time: ____________ Date: ____________ Movements: ____________ Start time: ____________ [name] time: ____________ Date: ____________ Movements:  ____________ Start time: ____________ [name] time: ____________ Date: ____________ Movements: ____________ Start time: ____________ [name] time: ____________  Date: ____________ Movements: ____________ Start time: ____________ [name] time: ____________ Date: ____________ Movements: ____________ Start time: ____________ [name] time: ____________ Date: ____________ Movements: ____________ Start time: ____________ [name] time: ____________ Date: ____________ Movements: ____________ Start time: ____________ [name] time: ____________ Date: ____________ Movements: ____________ Start time: ____________ [name] time: ____________ Date: ____________ Movements: ____________ Start time: ____________ [name] time: ____________ Date: ____________ Movements: ____________ Start time: ____________ [name] time: ____________  Date: ____________ Movements: ____________ Start time: ____________ [name] time: ____________ Date: ____________ Movements: ____________ Start time: ____________ [name] time: ____________ Date: ____________ Movements: ____________ Start time: ____________ [name] time: ____________ Date: ____________ Movements: ____________ Start time: ____________ [name] time: ____________ Date: ____________ Movements: ____________ Start time: ____________ [name] time: ____________ Date: ____________ Movements: ____________ Start time: ____________ [name] time: ____________ Date: ____________ Movements: ____________ Start time: ____________ [name] time: ____________  Date: ____________ Movements: ____________ Start time: ____________ [name]  time: ____________ Date: ____________ Movements: ____________ Start time: ____________ [name] time: ____________ Date: ____________ Movements: ____________ Start time: ____________ [name] time: ____________ Date: ____________ Movements: ____________ Start time: ____________ [name] time: ____________ Date: ____________ Movements: ____________ Start time: ____________ [name] time: ____________ Date: ____________ Movements: ____________ Start time: ____________ [name] time: ____________ Date: ____________ Movements: ____________ Start time: ____________ [name] time: ____________  Date: ____________ Movements: ____________ Start time: ____________ [name] time: ____________ Date: ____________ Movements: ____________ Start time: ____________ [name] time: ____________ Date: ____________ Movements: ____________ Start time: ____________ [name] time: ____________ Date: ____________ Movements: ____________ Start time: ____________ [name] time: ____________ Date: ____________ Movements: ____________ Start time: ____________ [name] time: ____________ Date: ____________ Movements: ____________ Start time: ____________ [name] time: ____________ Document Released: 12/22/2006 Document Revised: 11/11/2011 Document Reviewed: 06/24/2009 [employer] Patient Information 2012 [employer].

## 2012-03-24 ENCOUNTER — Encounter: Payer: Self-pay | Admitting: Family Medicine

## 2012-03-24 LAB — CBC
HCT: 34.6 % — ABNORMAL LOW (ref 36.0–46.0)
Hemoglobin: 10.6 g/dL — ABNORMAL LOW (ref 12.0–15.0)
MCH: 27.9 pg (ref 26.0–34.0)
MCHC: 30.6 g/dL (ref 30.0–36.0)
MCV: 91.1 fL (ref 78.0–100.0)
RDW: 13.7 % (ref 11.5–15.5)

## 2012-03-24 LAB — GLUCOSE TOLERANCE, 3 HOURS
Glucose Tolerance, 1 hour: 123 mg/dL (ref 70–189)
Glucose Tolerance, Fasting: 78 mg/dL (ref 70–104)

## 2012-04-19 ENCOUNTER — Ambulatory Visit (INDEPENDENT_AMBULATORY_CARE_PROVIDER_SITE_OTHER): Payer: Medicaid Other | Admitting: Family Medicine

## 2012-04-19 ENCOUNTER — Other Ambulatory Visit (HOSPITAL_COMMUNITY)
Admission: RE | Admit: 2012-04-19 | Discharge: 2012-04-19 | Disposition: A | Discharge status: Home / Self Care | Payer: Medicaid Other | Source: Ambulatory Visit | Attending: Family Medicine | Admitting: Family Medicine

## 2012-04-19 VITALS — BP 118/72 | Wt 238.0 lb

## 2012-04-19 DIAGNOSIS — Z3480 Encounter for supervision of other normal pregnancy, unspecified trimester: Secondary | ICD-10-CM

## 2012-04-19 DIAGNOSIS — Z113 Encounter for screening for infections with a predominantly sexual mode of transmission: Secondary | ICD-10-CM | POA: Insufficient documentation

## 2012-04-19 DIAGNOSIS — Z22330 Carrier of Group B streptococcus: Secondary | ICD-10-CM

## 2012-04-19 NOTE — Progress Notes (Signed)
S: 26 y.o. G3P2002 @ [redacted]w[redacted]d  (EDC 05/22/2012, by Last Menstrual Period )  -Doing well, no complaints -Good fetal movement: yes -Concerns include: pelvic bone pain    O:  Filed Vitals:   04/19/12 1418  BP: 118/72   See OB flowsheet GEN: Well-developed female in no acute distress.  ABD: Gravid. Positive fetal movement appreciated.  EXT: no edema.  Pelvic: normal external genitalia, thin d/c; GC/Chlam and GBS done  A/P: 26 y.o. G3P2002 @ [redacted]w[redacted]d  (EDC 05/22/2012, by Last Menstrual Period )  - 36wk cultures done today -GBS positive, will need treatment during labor/delivery -kick counts and labor precautions discussed -f/u in 2 weeks with me or OB clinic

## 2012-04-19 NOTE — Assessment & Plan Note (Signed)
Needs intrapartum abx.  Treated 02/2012, but no TOC has been done.  Will try to do at next appt in 2 weeks.

## 2012-04-19 NOTE — Patient Instructions (Signed)
It was great to see you!  Everything with you and baby look great! Make sure you have your nurse page me when you are admitted in labor: [phone]. If you feel like the baby is not moving as much as usual, please do "kick counts." To feel the baby, sit in a quiet place with your feet up, free of distractions. If you feel the baby kick 5 times in 1 hour, you can stop. If not, feel for a second hour. 10 kicks in 2 hours is very reassuring and makes [name] feel that the baby is doing well.  If you would like, you can dry drinking a small amount of caffeine before counting for the 2nd hour to help baby wake up.  If you do not feel 10 kicks in 2 hours while concentrating, please go to the MAU.  If you have regular contractions that are 3-5 minutes apart for at least one hour, bleeding or fluid from your vagina, or you do not feel the baby moving enough, please go to the [facility].  See you in 2 weeks!

## 2012-04-19 NOTE — Assessment & Plan Note (Signed)
Doing well, 35.2 today.  GC/Chlam and GBS done today (pt has compliance issues).  Good FM and FHT.  Labor precautions and kick counts discussed. Pt will use depo for birth control.

## 2012-04-20 ENCOUNTER — Encounter: Payer: Self-pay | Admitting: Family Medicine

## 2012-04-21 LAB — STREP B DNA PROBE: GBSP: POSITIVE

## 2012-04-24 ENCOUNTER — Encounter (HOSPITAL_COMMUNITY): Payer: Self-pay | Admitting: *Deleted

## 2012-04-24 ENCOUNTER — Inpatient Hospital Stay (HOSPITAL_COMMUNITY)
Admission: AD | Admit: 2012-04-24 | Discharge: 2012-04-24 | Disposition: A | Discharge status: Home / Self Care | Payer: Medicaid Other | Source: Ambulatory Visit | Attending: Obstetrics & Gynecology | Admitting: Obstetrics & Gynecology

## 2012-04-24 DIAGNOSIS — Z3480 Encounter for supervision of other normal pregnancy, unspecified trimester: Secondary | ICD-10-CM

## 2012-04-24 DIAGNOSIS — R109 Unspecified abdominal pain: Secondary | ICD-10-CM | POA: Insufficient documentation

## 2012-04-24 DIAGNOSIS — O4700 False labor before 37 completed weeks of gestation, unspecified trimester: Secondary | ICD-10-CM | POA: Insufficient documentation

## 2012-04-24 DIAGNOSIS — R51 Headache: Secondary | ICD-10-CM | POA: Insufficient documentation

## 2012-04-24 DIAGNOSIS — O479 False labor, unspecified: Secondary | ICD-10-CM

## 2012-04-24 DIAGNOSIS — Z22330 Carrier of Group B streptococcus: Secondary | ICD-10-CM

## 2012-04-24 LAB — URINALYSIS, ROUTINE W REFLEX MICROSCOPIC
Hgb urine dipstick: NEGATIVE
Nitrite: NEGATIVE
Protein, ur: NEGATIVE mg/dL
Urobilinogen, UA: 0.2 mg/dL (ref 0.0–1.0)

## 2012-04-24 MED ORDER — ACETAMINOPHEN 325 MG PO TABS
650.0000 mg | ORAL_TABLET | Freq: Four times a day (QID) | ORAL | Status: DC | PRN
  Administered 2012-04-24: 650 mg via ORAL
  Filled 2012-04-24: qty 2

## 2012-04-24 NOTE — MAU Note (Signed)
Pt reports she keeps getting light-headed and feels short of breath, states she is having pressure in her lower abd and lower back pain. States her feet are really swollen today.

## 2012-04-24 NOTE — MAU Note (Signed)
RT in with pt for EKG

## 2012-04-24 NOTE — MAU Provider Note (Signed)
Chief Complaint:  Abdominal Pain    First Provider Initiated Contact with Patient 04/24/12 2015      [name] is  26 y.o. [id number].  Patient's last menstrual period was 08/16/2011.. [redacted]w[redacted]d  She presents complaining of Abdominal Pain  Reports lower abd pain, vaginal pressure and mild frontal HA. Reports good fetal movement. Denies vaginal bleeding, dysuria, LOF, visual disturbance, epigastric pain, or back pain.  Obstetrical/Gynecological History: OB History    Grav Para Term Preterm Abortions TAB SAB Ect Mult Living   3 2 2  0 0 0 0 0 0 2      Past Medical History: Past Medical History  Diagnosis Date  . Urinary tract infection   . Ovarian cyst   . Anemia   . Depression     with 2nd preg  . Chlamydia     Past Surgical History: Past Surgical History  Procedure Date  . No past surgeries     Family History: Family History  Problem Relation Age of Onset  . Hypertension Mother   . Hypertension Father   . Diabetes Father   . Anesthesia problems Neg Hx     Social History: History  Substance Use Topics  . Smoking status: Former Smoker    Types: Cigarettes  . Smokeless tobacco: Never Used  . Alcohol Use: No    Allergies: No Known Allergies  Prescriptions prior to admission  Medication Sig Dispense Refill  . Prenatal Vit-Fe Fumarate-FA (PRENATAL MULTIVITAMIN) TABS Take 1 tablet by mouth daily.      Marland Kitchen pyridOXINE (VITAMIN B-6) 25 MG tablet Take 25 mg by mouth daily.      . ranitidine (ZANTAC) 150 MG tablet Take 1 tablet (150 mg total) by mouth 2 (two) times daily.  120 tablet  0    Review of Systems - Negative except what has been reviewed in HPI  Physical Exam   Blood pressure 125/59, pulse 80, temperature 97.3 F (36.3 C), temperature source Oral, resp. rate 20, height 5\' 8"  (1.727 m), weight 238 lb (107.956 kg), last menstrual period 08/16/2011, SpO2 97.00%.  General: General appearance - alert, well appearing, and in no distress, oriented to person,  place, and time and overweight Mental status - alert, oriented to person, place, and time, normal mood, behavior, speech, dress, motor activity, and thought processes, affect appropriate to mood Eyes - left eye normal, right eye normal Chest - clear to auscultation, no wheezes, rales or rhonchi, symmetric air entry Heart - normal rate, regular rhythm, normal S1, S2, no murmurs, rubs, clicks or gallops Abdomen - gravid, non tender Neurological - alert, oriented, normal speech, no focal findings or movement disorder noted, screening mental status exam normal, neck supple without rigidity, DTR's normal and symmetric Extremities - pedal edema 1 + Focused Gynecological Exam: 2/thick/post/vtx FHR: 130, mod variability, + 15x15 accels, no decels Toco: no contractions  Labs: Recent Results (from the past 24 hour(s))  URINALYSIS, ROUTINE W REFLEX MICROSCOPIC   Collection Time   04/24/12  7:40 PM      Component Value Range   Color, Urine YELLOW  YELLOW    APPearance CLEAR  CLEAR    Specific Gravity, Urine 1.020  1.005 - 1.030    pH 6.5  5.0 - 8.0    Glucose, UA NEGATIVE  NEGATIVE (mg/dL)   Hgb urine dipstick NEGATIVE  NEGATIVE    Bilirubin Urine NEGATIVE  NEGATIVE    Ketones, ur NEGATIVE  NEGATIVE (mg/dL)   Protein, ur NEGATIVE  NEGATIVE (  mg/dL)   Urobilinogen, UA 0.2  0.0 - 1.0 (mg/dL)   Nitrite NEGATIVE  NEGATIVE    Leukocytes, UA NEGATIVE  NEGATIVE    ED Course: Tylenol for HA  Assessment: False Labor Headache  Plan: Discharge home  Precautions reviewed FU as scheduled with PCP at [facility].  [name] E. 04/24/2012,8:17 PM

## 2012-04-24 NOTE — Discharge Instructions (Signed)

## 2012-04-24 NOTE — MAU Provider Note (Signed)
Attestation of Attending Supervision of Advanced Practitioner: Evaluation and management procedures were performed by the [facility] Fellow/PA/CNM/NP under my supervision and collaboration. Chart reviewed, and agree with management and plan.  Jaynie Collins, M.D. 04/24/2012 8:26 PM

## 2012-05-04 ENCOUNTER — Ambulatory Visit (INDEPENDENT_AMBULATORY_CARE_PROVIDER_SITE_OTHER): Payer: Medicaid Other | Admitting: Family Medicine

## 2012-05-04 VITALS — BP 120/75 | Wt 242.0 lb

## 2012-05-04 DIAGNOSIS — Z3480 Encounter for supervision of other normal pregnancy, unspecified trimester: Secondary | ICD-10-CM

## 2012-05-04 DIAGNOSIS — Z22330 Carrier of Group B streptococcus: Secondary | ICD-10-CM

## 2012-05-04 NOTE — Assessment & Plan Note (Signed)
Doing well, 37.3 weeks today. Bedside U/S confirms vertex presentation. Good FM and FHT. F/u 1-2 weeks.

## 2012-05-04 NOTE — Progress Notes (Signed)
S: 26 y.o. G3P2002 @ 102w3d  (EDC 05/22/2012, by Last Menstrual Period )  -Doing well -Good fetal movement: yes -Concerns include: pelvic bone pain, ready for baby to come    O:  Filed Vitals:   05/04/12 1407  BP: 120/75   See OB flowsheet GEN: Well-developed female in no acute distress.  ABD: Gravid. Positive fetal movement appreciated.  EXT: no edema.  Pelvic: normal external genitalia, thin d/c; GC/Chlam and GBS done  A/P: 26 y.o. G3P2002 @ [redacted]w[redacted]d  (EDC 05/22/2012, by Last Menstrual Period )  - VERTEX presentation confirmed by bedside ultrasound today -TOC for UTI sent today -GBS positive, will need treatment during labor/delivery -kick counts and labor precautions discussed -f/u in 1 week with me or OB clinic

## 2012-05-04 NOTE — Patient Instructions (Signed)
It was great to see you!  Everything with you and baby look great! Make sure you have your nurse page me when you are admitted in labor: [phone]. If you feel like the baby is not moving as much as usual, please do "kick counts." To feel the baby, sit in a quiet place with your feet up, free of distractions. If you feel the baby kick 5 times in 1 hour, you can stop. If not, feel for a second hour. 10 kicks in 2 hours is very reassuring and makes [name] feel that the baby is doing well.  If you would like, you can dry drinking a small amount of caffeine before counting for the 2nd hour to help baby wake up.  If you do not feel 10 kicks in 2 hours while concentrating, please go to the MAU.  If you have regular contractions that are 3-5 minutes apart for at least one hour, bleeding or fluid from your vagina, or you do not feel the baby moving enough, please go to the [facility].  See you in 1-2 weeks!

## 2012-05-06 ENCOUNTER — Encounter: Payer: Self-pay | Admitting: Family Medicine

## 2012-05-06 LAB — CULTURE, OB URINE: Colony Count: 3000

## 2012-05-13 ENCOUNTER — Inpatient Hospital Stay (HOSPITAL_COMMUNITY)
Admission: AD | Admit: 2012-05-13 | Discharge: 2012-05-15 | DRG: 775 | Disposition: A | Discharge status: Home / Self Care | Payer: Medicaid Other | Source: Ambulatory Visit | Attending: Obstetrics & Gynecology | Admitting: Obstetrics & Gynecology

## 2012-05-13 DIAGNOSIS — O99892 Other specified diseases and conditions complicating childbirth: Principal | ICD-10-CM | POA: Diagnosis present

## 2012-05-13 DIAGNOSIS — Z22330 Carrier of Group B streptococcus: Secondary | ICD-10-CM

## 2012-05-13 DIAGNOSIS — Z3480 Encounter for supervision of other normal pregnancy, unspecified trimester: Secondary | ICD-10-CM

## 2012-05-13 DIAGNOSIS — O9989 Other specified diseases and conditions complicating pregnancy, childbirth and the puerperium: Secondary | ICD-10-CM

## 2012-05-13 LAB — CBC
HCT: 35.3 % — ABNORMAL LOW (ref 36.0–46.0)
Hemoglobin: 11.3 g/dL — ABNORMAL LOW (ref 12.0–15.0)
MCH: 27.8 pg (ref 26.0–34.0)
MCHC: 32 g/dL (ref 30.0–36.0)
RDW: 13.3 % (ref 11.5–15.5)

## 2012-05-13 MED ORDER — FLEET ENEMA 7-19 GM/118ML RE ENEM: 1.0000 | ENEMA | RECTAL | Status: DC | PRN

## 2012-05-13 MED ORDER — ONDANSETRON HCL 4 MG/2ML IJ SOLN: 4.0000 mg | Freq: Four times a day (QID) | INTRAMUSCULAR | Status: DC | PRN

## 2012-05-13 MED ORDER — LIDOCAINE HCL (PF) 1 % IJ SOLN
30.0000 mL | INTRAMUSCULAR | Status: DC | PRN
  Filled 2012-05-13 (×2): qty 30

## 2012-05-13 MED ORDER — OXYTOCIN 20 UNITS IN LACTATED RINGERS INFUSION - SIMPLE
125.0000 mL/h | Freq: Once | INTRAVENOUS | Status: AC
  Administered 2012-05-14: 125 mL/h via INTRAVENOUS

## 2012-05-13 MED ORDER — CITRIC ACID-SODIUM CITRATE 334-500 MG/5ML PO SOLN: 30.0000 mL | ORAL | Status: DC | PRN

## 2012-05-13 MED ORDER — OXYTOCIN BOLUS FROM INFUSION
500.0000 mL | Freq: Once | INTRAVENOUS | Status: DC
  Filled 2012-05-13: qty 500
  Filled 2012-05-13: qty 1000

## 2012-05-13 MED ORDER — LACTATED RINGERS IV SOLN: 500.0000 mL | INTRAVENOUS | Status: DC | PRN

## 2012-05-13 MED ORDER — FENTANYL CITRATE 0.05 MG/ML IJ SOLN: 100.0000 ug | INTRAMUSCULAR | Status: DC | PRN

## 2012-05-13 MED ORDER — PENICILLIN G POTASSIUM 5000000 UNITS IJ SOLR
5.0000 10*6.[IU] | Freq: Once | INTRAVENOUS | Status: AC
  Administered 2012-05-13: 5 10*6.[IU] via INTRAVENOUS
  Filled 2012-05-13: qty 5

## 2012-05-13 MED ORDER — LACTATED RINGERS IV SOLN
INTRAVENOUS | Status: DC
  Administered 2012-05-13 – 2012-05-14 (×3): via INTRAVENOUS

## 2012-05-13 MED ORDER — ACETAMINOPHEN 325 MG PO TABS: 650.0000 mg | ORAL_TABLET | ORAL | Status: DC | PRN

## 2012-05-13 MED ORDER — IBUPROFEN 600 MG PO TABS
600.0000 mg | ORAL_TABLET | Freq: Four times a day (QID) | ORAL | Status: DC | PRN
  Administered 2012-05-14: 600 mg via ORAL
  Filled 2012-05-13: qty 1

## 2012-05-13 MED ORDER — PENICILLIN G POTASSIUM 5000000 UNITS IJ SOLR
2.5000 10*6.[IU] | INTRAVENOUS | Status: DC
  Administered 2012-05-14 (×4): 2.5 10*6.[IU] via INTRAVENOUS
  Filled 2012-05-13 (×8): qty 2.5

## 2012-05-13 MED ORDER — OXYCODONE-ACETAMINOPHEN 5-325 MG PO TABS: 1.0000 | ORAL_TABLET | ORAL | Status: DC | PRN

## 2012-05-13 NOTE — MAU Note (Signed)
Contractions since 0700 a.m. [redacted] weeks pregnant, every 5 minutes, no vaginal bleeding, pressure.

## 2012-05-13 NOTE — H&P (Signed)
[name] is a 26 y.o. female presenting for spontaneous onset of labor. Maternal Medical History:  Reason for admission: Reason for admission: contractions.  Reason for Admission:   nauseaContractions: Onset was 6-12 hours ago.   Frequency: regular.   Duration is approximately 45 seconds.   Perceived severity is strong.    Fetal activity: Patient states she usually does not feel baby move much, and there has been no change in the amount she is feeling.  She doesn't remember the last time she felt the baby move.  Prenatal complications: Infection (GBS +, UTI).   No bleeding, cholelithiasis, HIV, hypertension, IUGR, nephrolithiasis, oligohydramnios, placental abnormality, polyhydramnios, pre-eclampsia, preterm labor, substance abuse, thrombocytopenia or thrombophilia.   Prenatal Complications - Diabetes: none.    OB History    Grav Para Term Preterm Abortions TAB SAB Ect Mult Living   3 2 2  0 0 0 0 0 0 2     Past Medical History  Diagnosis Date  . Urinary tract infection   . Ovarian cyst   . Anemia   . Depression     with 2nd preg  . Chlamydia    Past Surgical History  Procedure Date  . No past surgeries    Family History: family history includes Diabetes in her father and Hypertension in her father and mother.  There is no history of Anesthesia problems. Social History:  reports that she has quit smoking. Her smoking use included Cigarettes. She has never used smokeless tobacco. She reports that she does not drink alcohol or use illicit drugs.  Review of Systems  Constitutional: Negative for fever, chills and diaphoresis.  HENT: Negative for neck pain.   Eyes: Negative for blurred vision and double vision.  Respiratory: Negative for cough and shortness of breath. Sputum production: Izabela Ow, kim.   Cardiovascular: Negative for chest pain and palpitations.  Gastrointestinal: Negative for nausea, vomiting, diarrhea and constipation.  Genitourinary: Negative for dysuria.    Musculoskeletal: Positive for back pain (Back pain all day, no fall/trauma, constant, no radiation, no loss of bowel or bladder control, no saddle anesthesia). Negative for joint pain.  Skin: Negative for rash.  Neurological: Negative for dizziness, tingling, seizures, loss of consciousness and headaches.  Endo/Heme/Allergies: Does not bruise/bleed easily.  Psychiatric/Behavioral: Negative for depression. The patient is not nervous/anxious.     Dilation: 6 Effacement (%): 60 Station: -2 Exam by:: c soliz rn Blood pressure 114/54, pulse 67, temperature 98.7 F (37.1 C), temperature source Oral, resp. rate 20, height 5\' 8"  (1.727 m), weight 109.77 kg (242 lb), last menstrual period 08/16/2011. Maternal Exam:  Uterine Assessment: Contraction strength is firm.  Contraction frequency is regular.   Abdomen: Fundal height is Appropriate for gestational age.       Physical Exam  Constitutional: She is oriented to person, place, and time. She appears well-developed and well-nourished.  HENT:  Head: Normocephalic and atraumatic.  Eyes: Conjunctivae and EOM are normal. Right eye exhibits no discharge. Left eye exhibits no discharge. No scleral icterus.  Neck: No tracheal deviation present.  Cardiovascular: Normal rate, regular rhythm and normal heart sounds.   Respiratory: Effort normal and breath sounds normal. No stridor.  GI: Soft. She exhibits no distension. There is no tenderness. There is no rebound and no guarding.       Gravid  Genitourinary:       Cervical exam deferred as recently performed by RN.  Musculoskeletal: She exhibits no edema.  Neurological: She is alert and oriented to person,  place, and time. No cranial nerve deficit. Coordination normal.  Skin: Skin is warm and dry.  Psychiatric: She has a normal mood and affect. Her behavior is normal. Judgment and thought content normal.    Prenatal labs: ABO, Rh: O/POS/-- (12/07 1608) Antibody: NEG (12/07 1608) Rubella: 92.3  (12/07 1608) RPR: NON REAC (04/18 0940)  HBsAg: NEGATIVE (12/07 1608)  HIV: NON REACTIVE (04/18 0940)  GBS: POSITIVE (05/15 1455)   Assessment/Plan: In labor Expectant management Patient does not desire epidural GBS +, receiving prophylaxis.  Clancy Gourd 05/13/2012, 10:39 PM  I have seen and examined this patient and I agree with the above. Meghana Tullo 2:26 AM 05/14/2012

## 2012-05-14 ENCOUNTER — Inpatient Hospital Stay (HOSPITAL_COMMUNITY): Payer: Medicaid Other

## 2012-05-14 ENCOUNTER — Encounter (HOSPITAL_COMMUNITY): Payer: Self-pay

## 2012-05-14 LAB — RPR: RPR Ser Ql: NONREACTIVE

## 2012-05-14 MED ORDER — OXYTOCIN 20 UNITS IN LACTATED RINGERS INFUSION - SIMPLE
1.0000 m[IU]/min | INTRAVENOUS | Status: DC
  Administered 2012-05-14: 2 m[IU]/min via INTRAVENOUS

## 2012-05-14 MED ORDER — DIBUCAINE 1 % RE OINT: 1.0000 "application " | TOPICAL_OINTMENT | RECTAL | Status: DC | PRN

## 2012-05-14 MED ORDER — WITCH HAZEL-GLYCERIN EX PADS: 1.0000 "application " | MEDICATED_PAD | CUTANEOUS | Status: DC | PRN

## 2012-05-14 MED ORDER — LANOLIN HYDROUS EX OINT: TOPICAL_OINTMENT | CUTANEOUS | Status: DC | PRN

## 2012-05-14 MED ORDER — PRENATAL MULTIVITAMIN CH
1.0000 | ORAL_TABLET | Freq: Every day | ORAL | Status: DC
  Administered 2012-05-15: 1 via ORAL
  Filled 2012-05-14: qty 1

## 2012-05-14 MED ORDER — SIMETHICONE 80 MG PO CHEW: 80.0000 mg | CHEWABLE_TABLET | ORAL | Status: DC | PRN

## 2012-05-14 MED ORDER — ONDANSETRON HCL 4 MG/2ML IJ SOLN: 4.0000 mg | INTRAMUSCULAR | Status: DC | PRN

## 2012-05-14 MED ORDER — TERBUTALINE SULFATE 1 MG/ML IJ SOLN: 0.2500 mg | Freq: Once | INTRAMUSCULAR | Status: DC | PRN

## 2012-05-14 MED ORDER — SENNOSIDES-DOCUSATE SODIUM 8.6-50 MG PO TABS
2.0000 | ORAL_TABLET | Freq: Every day | ORAL | Status: DC
  Administered 2012-05-14: 2 via ORAL

## 2012-05-14 MED ORDER — TETANUS-DIPHTH-ACELL PERTUSSIS 5-2.5-18.5 LF-MCG/0.5 IM SUSP
0.5000 mL | Freq: Once | INTRAMUSCULAR | Status: AC
  Administered 2012-05-15: 0.5 mL via INTRAMUSCULAR
  Filled 2012-05-14: qty 0.5

## 2012-05-14 MED ORDER — BENZOCAINE-MENTHOL 20-0.5 % EX AERO
1.0000 "application " | INHALATION_SPRAY | CUTANEOUS | Status: DC | PRN
  Filled 2012-05-14: qty 56

## 2012-05-14 MED ORDER — DIPHENHYDRAMINE HCL 25 MG PO CAPS: 25.0000 mg | ORAL_CAPSULE | Freq: Four times a day (QID) | ORAL | Status: DC | PRN

## 2012-05-14 MED ORDER — OXYCODONE-ACETAMINOPHEN 5-325 MG PO TABS: 1.0000 | ORAL_TABLET | ORAL | Status: DC | PRN

## 2012-05-14 MED ORDER — IBUPROFEN 600 MG PO TABS
600.0000 mg | ORAL_TABLET | Freq: Four times a day (QID) | ORAL | Status: DC
  Administered 2012-05-15 (×3): 600 mg via ORAL
  Filled 2012-05-14 (×3): qty 1

## 2012-05-14 MED ORDER — ZOLPIDEM TARTRATE 5 MG PO TABS: 5.0000 mg | ORAL_TABLET | Freq: Every evening | ORAL | Status: DC | PRN

## 2012-05-14 MED ORDER — ONDANSETRON HCL 4 MG PO TABS: 4.0000 mg | ORAL_TABLET | ORAL | Status: DC | PRN

## 2012-05-14 NOTE — Progress Notes (Signed)
  Subjective: Pt reports feels contractions; does not want epidural or pain meds.  Objective: BP 130/71  Pulse 75  Temp(Src) 98.3 F (36.8 C) (Oral)  Resp 18  Ht 5\' 8"  (1.727 m)  Wt 109.77 kg (242 lb)  BMI 36.80 kg/m2  LMP 08/16/2011      FHT:  FHR: 130's bpm, variability: moderate,  accelerations:  Present,  decelerations:  Absent; 10x10 accels UC:   irregular, every 5-6 minutes SVE:   Dilation: 5 Effacement (%): 50 Station: Ballotable Exam by:: L. mcDaniel RN  Labs: Lab Results  Component Value Date   WBC 7.0 05/13/2012   HGB 11.3* 05/13/2012   HCT 35.3* 05/13/2012   MCV 86.9 05/13/2012   PLT 138* 05/13/2012    Assessment / Plan: Inadequate Contractions  Labor: Inadequate Contractions Preeclampsia:  n/a Fetal Wellbeing:  Category I Pain Control:  Labor support without medications I/D:  n/a Anticipated MOD:  NSVD  [facility] 05/14/2012, 9:42 AM

## 2012-05-14 NOTE — Progress Notes (Signed)
Ilean Skill, CNM notified of SVE with indeterminate presenting part, UCs FHR tracing, pt pain level, and questionable AROM. Order given for bedside US and pitocin pending vertex presentation.

## 2012-05-14 NOTE — Progress Notes (Signed)
[name] is a 26 y.o. G3P2002 at [redacted]w[redacted]d  Subjective:  Patient comfortable, has gotten some sleep.  Objective: BP 139/56  Pulse 95  Temp(Src) 98.7 F (37.1 C) (Oral)  Resp 20  Ht 5\' 8"  (1.727 m)  Wt 109.77 kg (242 lb)  BMI 36.80 kg/m2  LMP 08/16/2011      FHT:  FHR: 125-130 bpm, variability: moderate,  accelerations:  Present,  decelerations:  Present occasional variables UC:   irregular, every 3-6 minutes SVE:   Dilation: 7 Effacement (%): 80 Station: -2 Exam by:: dr Lahoma Rocker AROM performed at 0603.  Small amount of clear fluid.  Labs: Lab Results  Component Value Date   WBC 7.0 05/13/2012   HGB 11.3* 05/13/2012   HCT 35.3* 05/13/2012   MCV 86.9 05/13/2012   PLT 138* 05/13/2012    Assessment / Plan: Spontaneous labor, progressing normally  Labor: Progressing normally Fetal Wellbeing:  Category I Pain Control:  Labor support without medications Anticipated MOD:  NSVD  Stancil Deisher 05/14/2012, 7:04 AM

## 2012-05-14 NOTE — Progress Notes (Signed)
I have seen and examined this patient and I agree with the above. [name] 2:25 AM 05/14/2012

## 2012-05-14 NOTE — Progress Notes (Signed)
I have seen and examined this patient and I agree with the above. Yong Wahlquist 9:03 AM 05/14/2012    

## 2012-05-14 NOTE — Progress Notes (Signed)
Delivery of live viable female by W. Muhummad, CNM. APGARS 9,9

## 2012-05-14 NOTE — Progress Notes (Signed)
[name] is a 26 y.o. G3P2002 at [redacted]w[redacted]d by LMP admitted for active labor  Subjective:  Comfortable  Objective: BP 125/65  Pulse 82  Temp(Src) 98.7 F (37.1 C) (Oral)  Resp 20  Ht 5\' 8"  (1.727 m)  Wt 109.77 kg (242 lb)  BMI 36.80 kg/m2  LMP 08/16/2011      FHT:  FHR: 145 bpm, variability: moderate,  accelerations:  Present,  decelerations:  Present occasional variables UC:   irregular, every 4-6 minutes SVE:   Dilation: 4.5 Effacement (%): 50 Station: -2 Exam by:: k shaw cnm  Labs: Lab Results  Component Value Date   WBC 7.0 05/13/2012   HGB 11.3* 05/13/2012   HCT 35.3* 05/13/2012   MCV 86.9 05/13/2012   PLT 138* 05/13/2012    Assessment / Plan: Cervical exam now 4.5 cm.  May not be in active labor. EFM reassuring Patient offered the option of going home versus waiting until early morning for another cervical check.  She understands that if her cervix fails to change, she may not be in early labor, at which point she would be discharged to home.  Labor: See above Fetal Wellbeing:  Category I Pain Control:  Labor support without medications Anticipated MOD:  NSVD  Mazin Emma 05/14/2012, 1:06 AM

## 2012-05-14 NOTE — Progress Notes (Signed)
  Subjective: Pt reports increase in contractions.  No questions or concerns.   Objective: BP 141/84  Pulse 76  Temp(Src) 98.3 F (36.8 C) (Oral)  Resp 18  Ht 5\' 8"  (1.727 m)  Wt 109.77 kg (242 lb)  BMI 36.80 kg/m2  LMP 08/16/2011      FHT:  FHR: 130's bpm, variability: moderate,  accelerations:  Present,  decelerations:  Absent UC:   irregular, every 1-4 minutes SVE:   Dilation: 5 Effacement (%): 50 Station: Ballotable Exam by:: L. mcDaniel RN  Labs: Lab Results  Component Value Date   WBC 7.0 05/13/2012   HGB 11.3* 05/13/2012   HCT 35.3* 05/13/2012   MCV 86.9 05/13/2012   PLT 138* 05/13/2012    Assessment / Plan: Augmentation of Labor  Labor: Augmentation of Labor Preeclampsia:  n/a Fetal Wellbeing:  Category I Pain Control:  Labor support without medications I/D:  n/a Anticipated MOD:  NSVD  [facility] 05/14/2012, 12:33 PM

## 2012-05-14 NOTE — Progress Notes (Addendum)
   Subjective: Pt reports similar intensity with contractions.    Objective: BP 144/74  Pulse 90  Temp(Src) 98.3 F (36.8 C) (Oral)  Resp 18  Ht 5\' 8"  (1.727 m)  Wt 109.77 kg (242 lb)  BMI 36.80 kg/m2  LMP 08/16/2011      FHT:  FHR: 130's bpm, variability: moderate,  accelerations:  Present,  decelerations:  Present intermittent variables UC:   irregular, every 2.5-3 minutes SVE:   Dilation: 5 Effacement (%): 50 Station: Ballotable Exam by:: L. mcDaniel RN  Labs: Lab Results  Component Value Date   WBC 7.0 05/13/2012   HGB 11.3* 05/13/2012   HCT 35.3* 05/13/2012   MCV 86.9 05/13/2012   PLT 138* 05/13/2012    Assessment / Plan: Irregular Contractions  Labor: Irregular Contractions Preeclampsia:  n/a Fetal Wellbeing:  Category II Pain Control:  Labor support without medications I/D:  n/a Anticipated MOD:  NSVD  AROM>clear fluid  [facility] 05/14/2012, 3:13 PM

## 2012-05-15 ENCOUNTER — Encounter (HOSPITAL_COMMUNITY): Payer: Self-pay

## 2012-05-15 MED ORDER — IBUPROFEN 600 MG PO TABS: 600.0000 mg | ORAL_TABLET | Freq: Four times a day (QID) | ORAL | Status: AC

## 2012-05-15 MED ORDER — LANOLIN HYDROUS EX OINT: 1.0000 "application " | TOPICAL_OINTMENT | CUTANEOUS | Status: DC | PRN

## 2012-05-15 NOTE — H&P (Signed)
Attestation of Attending Supervision of Advanced Practitioner: Evaluation and management procedures were performed by the [facility] Fellow/PA/CNM/NP under my supervision and collaboration. Chart reviewed, and agree with management and plan.  Jaynie Collins, M.D. 05/15/2012 7:50 AM

## 2012-05-15 NOTE — Discharge Summary (Addendum)
Obstetric Discharge Summary Reason for Admission: onset of labor Prenatal Procedures: none Intrapartum Procedures: spontaneous vaginal delivery Postpartum Procedures: none Complications-Operative and Postpartum: none Hemoglobin  Date Value Range Status  05/13/2012 11.3* 12.0-15.0 (g/dL) Final     HCT  Date Value Range Status  05/13/2012 35.3* 36.0-46.0 (%) Final    Physical Exam:  General: alert, cooperative and no distress Lochia: appropriate Uterine Fundus: firm DVT Evaluation: No evidence of DVT seen on physical exam.  Discharge Diagnoses: Term Pregnancy-delivered  Discharge Information: Date: 05/15/2012 Activity: pelvic rest Diet: routine Medications: Ibuprofen Condition: stable Instructions: refer to practice specific booklet Discharge to: home Follow-up Information    Please follow up. (Follow up with your prenatal provider in 6 weeks.  Follow up in MAU as needed.)          Newborn Data: Live born female  Birth Weight: 8 lb 6 oz (3799 g) APGAR: 9, 9  Home with mother.  Clancy Gourd 05/15/2012, 7:04 AM   Delivery Note At 4:10 PM a viable and healthy female was delivered via Vaginal, Spontaneous Delivery (Presentation: ; Occiput Anterior).  APGAR: 9, 9; weight .   Placenta status: Intact, Spontaneous.  Cord: 3 vessels with the following complications: None.  Cord pH: n/a  Anesthesia: None  Episiotomy: None Lacerations: None Suture Repair: n/a Est. Blood Loss (mL): 300  Mom to postpartum.  Baby to nursery-stable.  [facility] 05/14/2012, 5:00 PM

## 2012-05-15 NOTE — Progress Notes (Signed)
Ur chart review completed.  

## 2012-05-15 NOTE — Discharge Summary (Signed)
I examined pt and agree with documentation above and resident plan of care. [name],Erin Aleshire  

## 2012-05-16 ENCOUNTER — Encounter: Payer: Medicaid Other | Admitting: Family Medicine

## 2012-05-30 NOTE — Discharge Summary (Signed)
Attestation of Attending Supervision of Resident: Evaluation and management procedures were performed by the [facility] Medicine Resident under my supervision.  I have seen and examined the patient, reviewed the resident's note and chart, and I agree with the management and plan.   Jaynie Collins, M.D. 05/30/2012 9:17 AM

## 2012-06-26 ENCOUNTER — Ambulatory Visit (INDEPENDENT_AMBULATORY_CARE_PROVIDER_SITE_OTHER): Payer: Medicaid Other | Admitting: Family Medicine

## 2012-06-26 ENCOUNTER — Encounter: Payer: Self-pay | Admitting: Family Medicine

## 2012-06-26 VITALS — BP 132/88 | HR 91 | Temp 97.9°F | Ht 68.0 in | Wt 216.0 lb

## 2012-06-26 DIAGNOSIS — Z3049 Encounter for surveillance of other contraceptives: Secondary | ICD-10-CM

## 2012-06-26 DIAGNOSIS — Z3042 Encounter for surveillance of injectable contraceptive: Secondary | ICD-10-CM

## 2012-06-26 MED ORDER — MEDROXYPROGESTERONE ACETATE 150 MG/ML IM SUSP
150.0000 mg | Freq: Once | INTRAMUSCULAR | Status: AC
  Administered 2012-06-26: 150 mg via INTRAMUSCULAR

## 2012-06-26 NOTE — Patient Instructions (Addendum)
Great job on breastfeeding!  Do gentle exercise to help get back into shape  If you have worsening vision, double vision, or other concerns, please return for recheck

## 2012-06-26 NOTE — Progress Notes (Signed)
  Subjective:    Patient ID: [name], female    DOB: 05-Aug-1986, 26 y.o.   MRN: [id number]  HPI Here for postpartum check  SVD on 6/10,  Normal postpartum course  Baby doing well.  Breastfeeding.  [facility] pregnancy home screening performed.  Neg domestic violence and depression screening.  No further vaginal bleeding or abdominal pain.  Has not been sexually active.  Continues to have pain at pubic symphysis, wonder if this is normal.  Notices out of the corner of both eyes, she sees a light that lasts for a few seconds.  Not provoked by anything.  Does not change positions,  No double vision or change in vision.  No associated headache, dizziness, numbness, tingling.  Review of Systems see HPI   Objective:   Physical Exam  GEN: Alert & Oriented, No acute distress EYES: EOMI, PERRLA, fundoscopic exams shows no papilledema. CV:  Regular Rate & Rhythm, no murmur Respiratory:  Normal work of breathing, CTAB Abd:  + BS, soft, no tenderness to palpation Pelvic Exam:        External: normal female genitalia without lesions or masses        Vagina: normal without lesions or masses        Cervix: normal without lesions or masses        Adnexa: normal bimanual exam without masses or fullness        Uterus: normal by palpation             Assessment & Plan:  Normal postpartum check.    Contraception:  Declines need for counseling.  Has decided on depo provera  Pubic symphysis: encouraged gentle return to exercise, will resolve with time  Vision complaint, self limited,no evidence of papilledema.  No other neurological signs.  Non specific, advised continued monitoring, return if does not resolve, or has vision change ,double vision, or other concerns.

## 2012-06-27 ENCOUNTER — Ambulatory Visit: Payer: Medicaid Other | Admitting: Family Medicine

## 2012-09-19 IMAGING — US US OB COMP LESS 14 WK
1 series · 14 of 28 positions shown · non-contrast
Comparison: None.

CLINICAL DATA: Vaginal bleeding with pain.  Positive pregnancy
test.

OBSTETRIC <14 WK US AND TRANSVAGINAL OB US
TECHNIQUE: Both transabdominal and transvaginal ultrasound
examinations were performed for complete evaluation of the
gestation as well as the maternal uterus, adnexal regions, and
pelvic cul-de-sac.  Transvaginal technique was performed to assess
early pregnancy.

[Series 1: us ob comp less 14 wks · 14 of 44 slices shown]
[im 2/44]
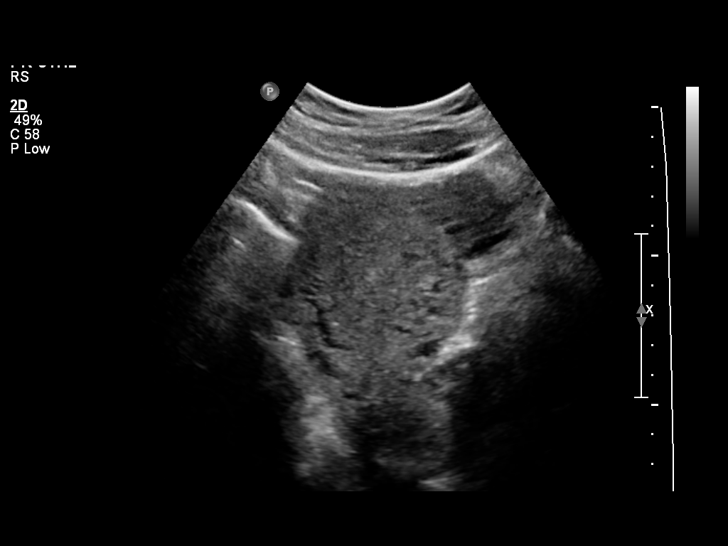
[im 5/44]
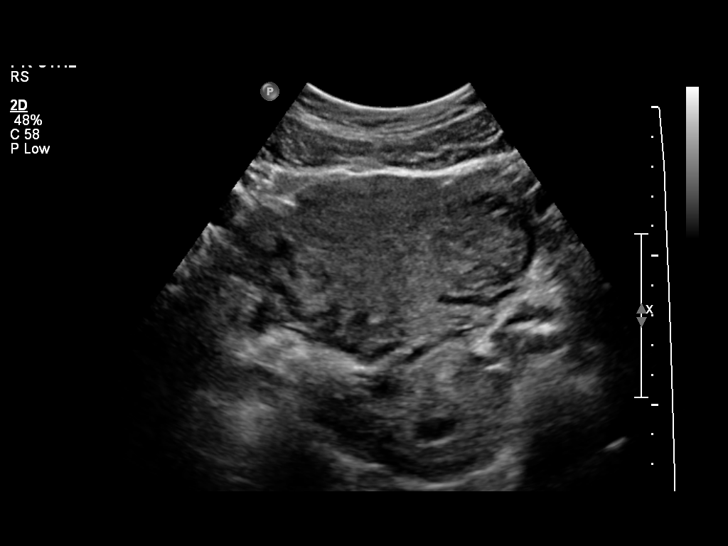
[im 8/44]
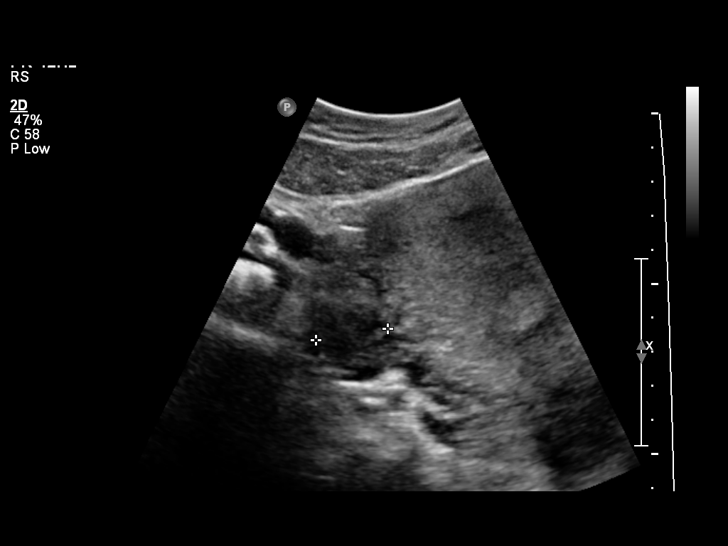
[im 12/44]
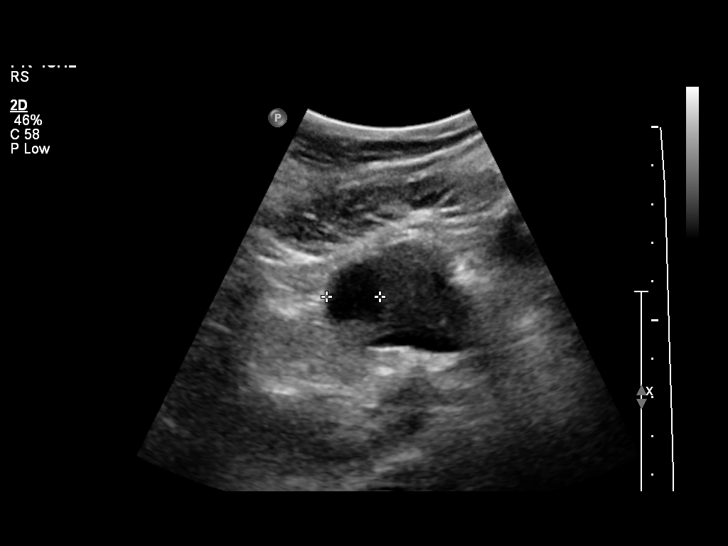
[im 15/44]
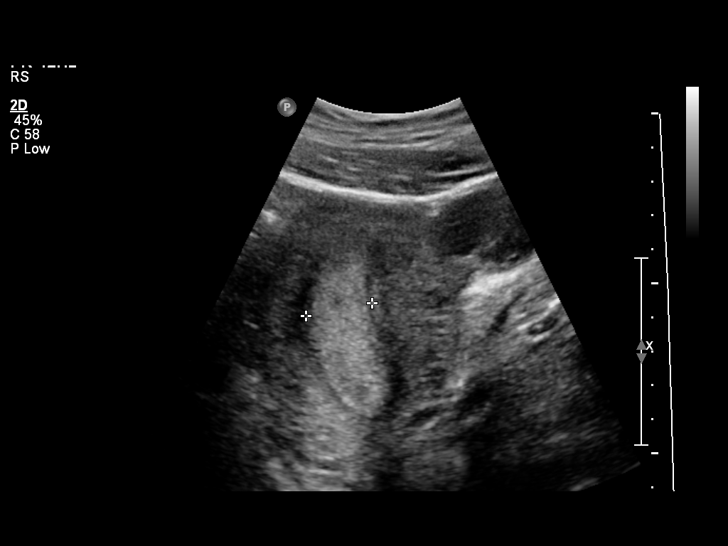
[im 18/44]
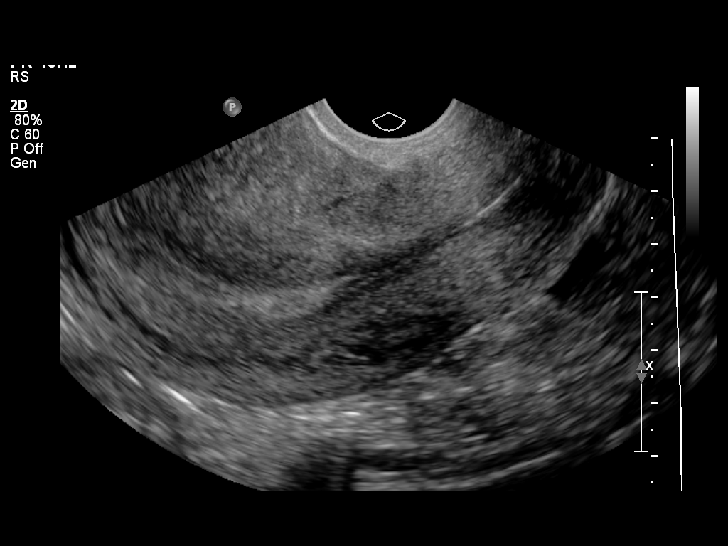
[im 21/44]
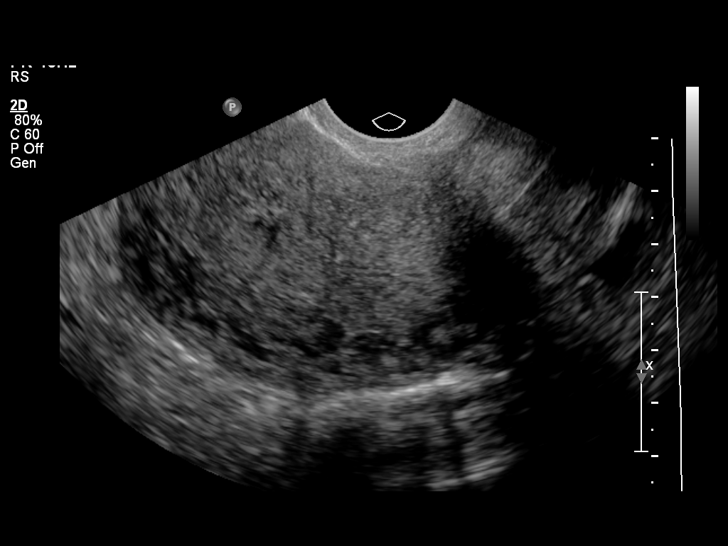
[im 24/44]
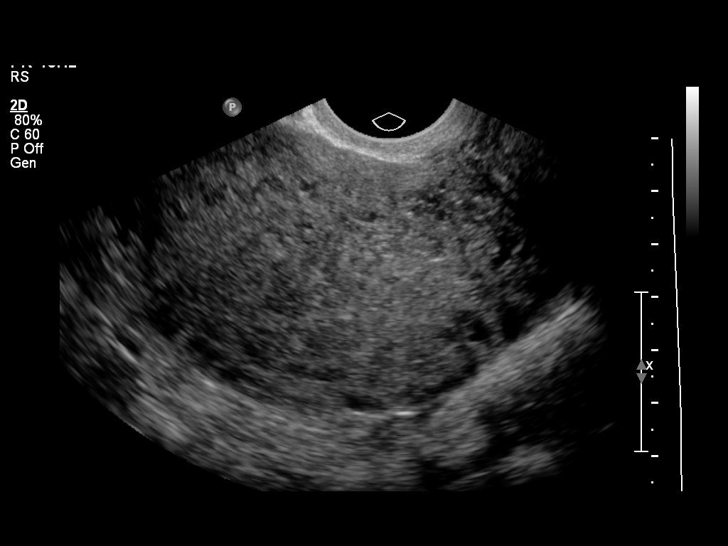
[im 28/44]
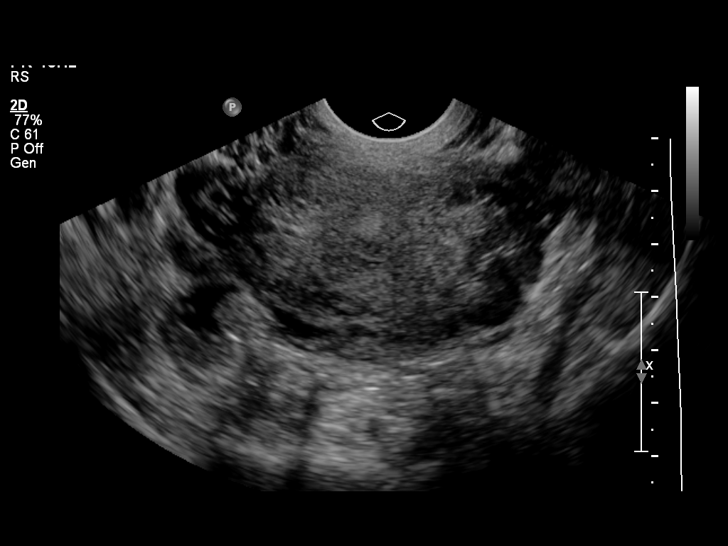
[im 31/44]
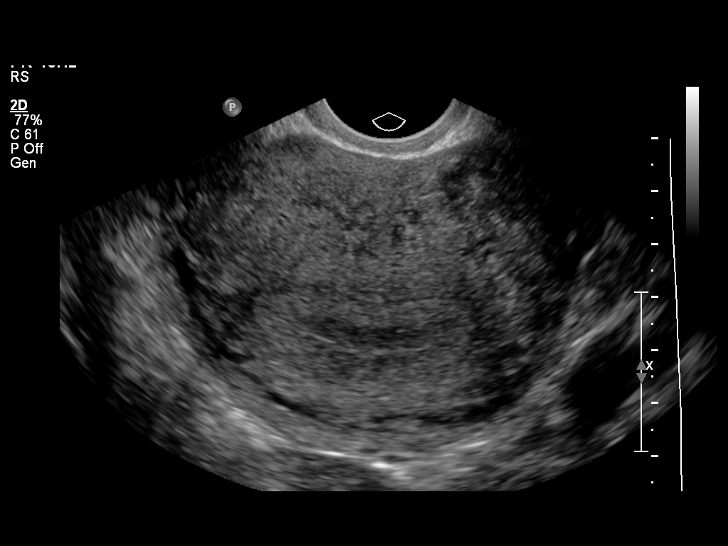
[im 34/44]
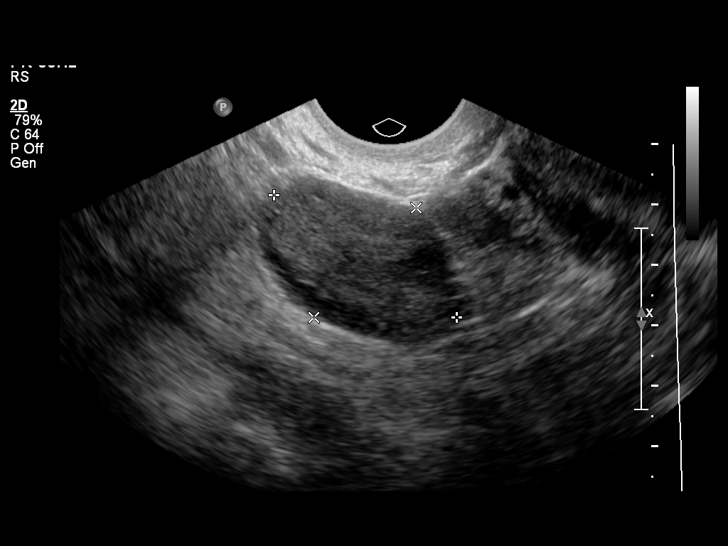
[im 37/44]
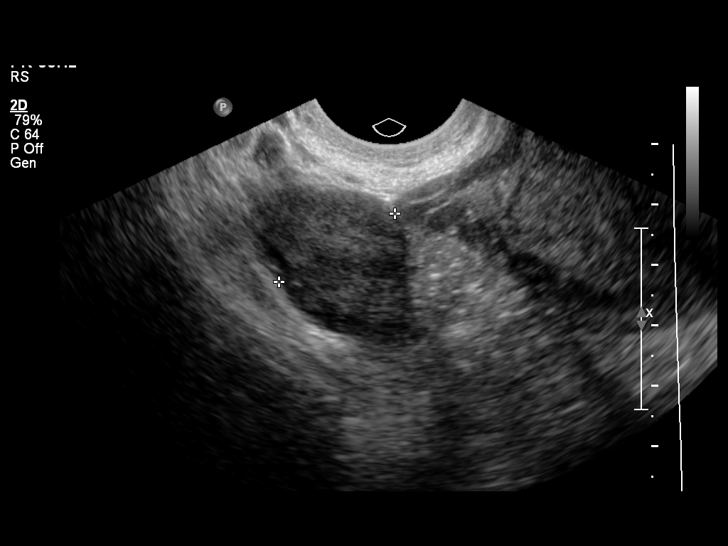
[im 40/44]
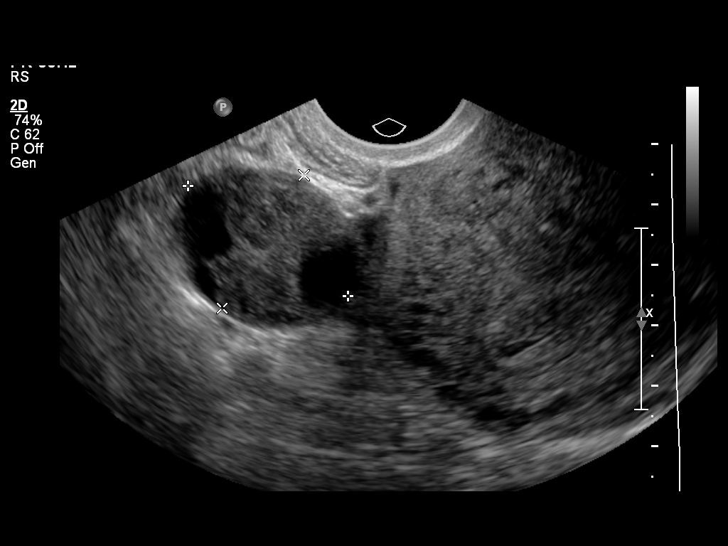
[im 44/44]
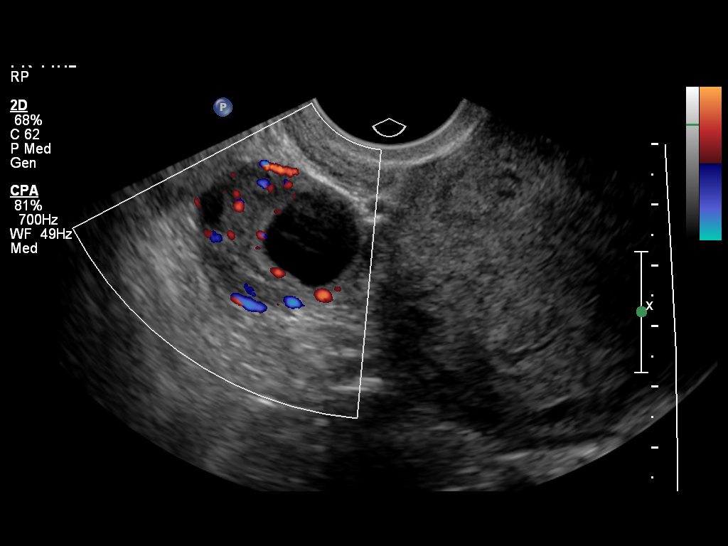

[14 of 28 positions shown; findings below may reference images not displayed]

Intrauterine gestational sac:  Not visualized
Yolk sac: Not visualized
Embryo: Not visualized the
Cardiac Activity: N/A

Maternal uterus/adnexae:
The maternal right ovary is 2.2 x 1.7 x 2.1 cm.  The left ovary
measures 2.9 x 3.3 x 2.4 cm.  The small follicle is identified
within the left ovarian parenchyma.  There is no free fluid in the
cul-de-sac.
IMPRESSION: No intrauterine gestational sac. Given the history of a positive
pregnancy test, differential considerations include intrauterine
gestation too early to visualize, completed abortion, or
nonvisualized ectopic pregnancy. Close clinical correlation is
recommended with serial beta-hCG and followup ultrasound as
warranted.

## 2012-09-26 ENCOUNTER — Ambulatory Visit (INDEPENDENT_AMBULATORY_CARE_PROVIDER_SITE_OTHER): Payer: Managed Care, Other (non HMO) | Admitting: *Deleted

## 2012-09-26 ENCOUNTER — Other Ambulatory Visit: Payer: Self-pay | Admitting: Family Medicine

## 2012-09-26 DIAGNOSIS — Z309 Encounter for contraceptive management, unspecified: Secondary | ICD-10-CM

## 2012-09-26 MED ORDER — MEDROXYPROGESTERONE ACETATE 150 MG/ML IM SUSP
150.0000 mg | Freq: Once | INTRAMUSCULAR | Status: AC
  Administered 2012-09-26: 150 mg via INTRAMUSCULAR

## 2012-09-26 MED ORDER — MEDROXYPROGESTERONE ACETATE 150 MG/ML IM SUSP: 150.0000 mg | INTRAMUSCULAR | Status: DC

## 2012-09-26 NOTE — Progress Notes (Signed)
Patient one day late for Depo injection. Urine pregnancy test done and is negative.   Has had sex in past 5 days. Emergency contraception  Information given.   Return in 2 weeks for another urine preg test.   Next depo due Jan 7 thru Dec 26, 2012.

## 2012-10-10 ENCOUNTER — Other Ambulatory Visit: Payer: Managed Care, Other (non HMO)

## 2013-07-07 ENCOUNTER — Inpatient Hospital Stay (HOSPITAL_COMMUNITY)
Admission: AD | Admit: 2013-07-07 | Discharge: 2013-07-08 | Disposition: A | Discharge status: Home / Self Care | Payer: Self-pay | Source: Ambulatory Visit | Attending: Obstetrics & Gynecology | Admitting: Obstetrics & Gynecology

## 2013-07-07 ENCOUNTER — Encounter (HOSPITAL_COMMUNITY): Payer: Self-pay | Admitting: *Deleted

## 2013-07-07 DIAGNOSIS — R109 Unspecified abdominal pain: Secondary | ICD-10-CM | POA: Insufficient documentation

## 2013-07-07 DIAGNOSIS — N946 Dysmenorrhea, unspecified: Secondary | ICD-10-CM | POA: Insufficient documentation

## 2013-07-07 DIAGNOSIS — N943 Premenstrual tension syndrome: Secondary | ICD-10-CM

## 2013-07-07 LAB — URINALYSIS, ROUTINE W REFLEX MICROSCOPIC
Glucose, UA: NEGATIVE mg/dL
Leukocytes, UA: NEGATIVE
Protein, ur: NEGATIVE mg/dL
pH: 6.5 (ref 5.0–8.0)

## 2013-07-07 NOTE — MAU Note (Signed)
My period is a week late and having bad cramping on L ovary. Having symptoms of period but it is not coming. Pain L side for a week

## 2013-07-08 ENCOUNTER — Inpatient Hospital Stay (HOSPITAL_COMMUNITY): Payer: Self-pay

## 2013-07-08 DIAGNOSIS — N943 Premenstrual tension syndrome: Secondary | ICD-10-CM

## 2013-07-08 MED ORDER — IBUPROFEN 600 MG PO TABS: 600.0000 mg | ORAL_TABLET | Freq: Four times a day (QID) | ORAL | Status: DC | PRN

## 2013-07-08 NOTE — MAU Provider Note (Signed)
History     CSN: [id number]  Arrival date and time: 07/07/13 2301   First Provider Initiated Contact with Patient 07/08/13 0016      Chief Complaint  Patient presents with  . Abdominal Pain   Abdominal Pain Associated symptoms include headaches. Pertinent negatives include no constipation, diarrhea, fever, nausea or vomiting.    Pt is a [id number] here with report of menstrual cycle being a week late.  Patient's last menstrual period was 06/02/2013.   Concerned due to having LLQ pelvic x one week.  Pain is intermittent and described as a pressure and rated 8/10.  Took Aleve with minimal relief.  No report of abnormal vaginal discharge.    Past Medical History  Diagnosis Date  . Urinary tract infection   . Ovarian cyst   . Anemia   . Depression     with 2nd preg  . Chlamydia     Past Surgical History  Procedure Laterality Date  . No past surgeries      Family History  Problem Relation Age of Onset  . Hypertension Mother   . Hypertension Father   . Diabetes Father   . Anesthesia problems Neg Hx     History  Substance Use Topics  . Smoking status: Former Smoker    Types: Cigarettes  . Smokeless tobacco: Never Used  . Alcohol Use: Yes     Comment: occassional    Allergies: No Known Allergies  Prescriptions prior to admission  Medication Sig Dispense Refill  . lanolin OINT Apply 1 application topically as needed (for breast care).  7 g  0  . Prenatal Vit-Fe Fumarate-FA (PRENATAL MULTIVITAMIN) TABS Take 1 tablet by mouth every morning.         Review of Systems  Constitutional: Positive for malaise/fatigue. Negative for fever and chills.  Gastrointestinal: Positive for abdominal pain (LLQ). Negative for nausea, vomiting, diarrhea and constipation.  Genitourinary: Negative.   Musculoskeletal: Positive for back pain.  Neurological: Positive for headaches.  All other systems reviewed and are negative.   Physical Exam   Blood pressure 138/88, pulse 71,  temperature 98 F (36.7 C), resp. rate 18, height 5\' 8"  (1.727 m), weight 90.992 kg (200 lb 9.6 oz), last menstrual period 06/02/2013.  Physical Exam  Constitutional: She is oriented to person, place, and time. She appears well-developed and well-nourished. No distress.  HENT:  Head: Normocephalic.  Neck: Normal range of motion. Neck supple.  Cardiovascular: Normal rate, regular rhythm and normal heart sounds.   Respiratory: Effort normal and breath sounds normal. No respiratory distress.  GI: Soft.  Genitourinary: Uterus is not enlarged and not tender. Right adnexum displays no mass and no tenderness. Left adnexum displays mass and tenderness.  Musculoskeletal: Normal range of motion. She exhibits no edema.  Neurological: She is alert and oriented to person, place, and time. She has normal reflexes.  Skin: Skin is warm and dry.    MAU Course  Procedures  Results for orders placed during the hospital encounter of 07/07/13 (from the past 24 hour(s))  URINALYSIS, ROUTINE W REFLEX MICROSCOPIC     Status: Abnormal   Collection Time    07/07/13 11:24 PM      Result Value Range   Color, Urine YELLOW  YELLOW   APPearance CLEAR  CLEAR   Specific Gravity, Urine 1.025  1.005 - 1.030   pH 6.5  5.0 - 8.0   Glucose, UA NEGATIVE  NEGATIVE mg/dL   Hgb urine dipstick NEGATIVE  NEGATIVE  Bilirubin Urine NEGATIVE  NEGATIVE   Ketones, ur 15 (*) NEGATIVE mg/dL   Protein, ur NEGATIVE  NEGATIVE mg/dL   Urobilinogen, UA 1.0  0.0 - 1.0 mg/dL   Nitrite NEGATIVE  NEGATIVE   Leukocytes, UA NEGATIVE  NEGATIVE  POCT PREGNANCY, URINE     Status: None   Collection Time    07/07/13 11:39 PM      Result Value Range   Preg Test, Ur NEGATIVE  NEGATIVE   Ultrasound: Findings:  Uterus: The uterus is unremarkable measuring 11.2 x 5.7 x 7.7 cm.  Endometrium: Normal in thickness and appearance measuring 8 mm.  Right ovary: The right ovary is unremarkable measuring 3.8 x 2.5 x  2.9 cm. Internal vascular flow  is noted.  Left ovary: The left ovary is unremarkable measuring 4.8 x 2.1 x  2.5 cm. Internal vascular flow is noted.  Other findings: A small amount of free pelvic fluid may be  physiologic.  There is no evidence of adnexal mass.  IMPRESSION:  Small amount of free pelvic fluid which can be physiologic. No  other significant abnormalities identified.   Assessment and Plan  Premenstrual Cramping  Plan: Provide reassurance. Continue ibuprofen. Follow-up if no cycle in 2 weeks.    [facility] 07/08/2013, 12:17 AM

## 2013-10-24 ENCOUNTER — Ambulatory Visit (INDEPENDENT_AMBULATORY_CARE_PROVIDER_SITE_OTHER): Payer: Medicaid Other | Admitting: Family Medicine

## 2013-10-24 ENCOUNTER — Encounter: Payer: Self-pay | Admitting: Family Medicine

## 2013-10-24 VITALS — BP 128/77 | HR 73 | Temp 98.8°F | Wt 207.0 lb

## 2013-10-24 DIAGNOSIS — Z3046 Encounter for surveillance of implantable subdermal contraceptive: Secondary | ICD-10-CM

## 2013-10-24 DIAGNOSIS — Z309 Encounter for contraceptive management, unspecified: Secondary | ICD-10-CM

## 2013-10-24 DIAGNOSIS — Z975 Presence of (intrauterine) contraceptive device: Secondary | ICD-10-CM

## 2013-10-24 LAB — POCT URINE PREGNANCY: Preg Test, Ur: NEGATIVE

## 2013-10-24 NOTE — Assessment & Plan Note (Signed)
Desires contraception. Has used numerous forms in the past and has become pregnant on other forms in the past. Discussed risks/benefits/side effects and pt wishes to have Nexplanon placed. Pregnancy test negative. Nexplanon placed today without immediate complication. Discussed local care of wound, signs/symptoms for when to return to clinic (i.e., local or systemic evidence of infection, symptoms of allergic reaction, etc). See procedure note, above. F/u with PCP as needed.

## 2013-10-24 NOTE — Patient Instructions (Signed)
Thank you for coming in, today!  Keep your dressing on for 24 hours. Then take it off and keep the area clean with normal soap and water. If you notice any redness, swelling, bleeding or drainage, call or come back in. If you have any fever, chills, nausea, vomiting, or belly pain, call or come back in.  You can also read the information from Nexplanon's website.  Otherwise, see Dr. Richarda Blade, as you need. Please feel free to call with any questions or concerns at any time, at [phone]. --Dr. Casper Harrison

## 2013-10-24 NOTE — Progress Notes (Signed)
  Subjective:    Patient ID: [name], female    DOB: December 02, 1986, 27 y.o.   MRN: [id number]  HPI: Pt presents to clinic for Nexplanon insertion. Pt desires contraception and has been on on Depo-Provera, patches, Nuvaring, and pills at various times in the past. Pt states she has "gotten pregnant on other forms of birth control" before, and chose to try Nexplanon because she has "heard a lot of bad things about IUD's." Pt's last period was about 2 days (currently experiencing period symptoms); periods typically are regular, lasting 4-5 days. Pt states she is not currently sexually active but reports her last activity was about 1 week ago (pt has 1 female partner, not using condoms). Pt is right-handed.   Review of Systems: As above. Generally feels well. Denies vaginal discharge, endorses menses currently. No fever/chills, N/V, change in bowel/bladder habits, abd pain, SOB, chest pain, or coryza-type symptoms.     Objective:   Physical Exam BP 128/77  Pulse 73  Temp(Src) 98.8 F (37.1 C) (Oral)  Wt 207 lb (93.895 kg)  LMP 09/21/2013  Gen: well-appearing adult female in NAD HEENT: [place]/AT, EOMI, MMM Cardio: RRR, no murmur Pulm: CTAB, no wheezes, normal WOB on room air Ext: warm, well-perfused, no upper or lower ext edema  Left upper extremity without rash, swelling, tenderness or deformity  Medial epicondyle easily palpable     Assessment & Plan:  See problem list note.  IMPLANON INSERTION PROCEDURE NOTE:  Patient given informed consent and signed copy to be left in the chart.  Pregnancy test was NEGATIVE, as above.  Appropriate time out was taken. Pt identified by name, DOB, MRN.  Landmarks identified, appropriate measurements made, and insertion site marked. Skin cleaned with alcohol. Local anesthesia achieved with 5mL of Xylocaine with epinephrine. Nondominant (left) arm was prepped in the usual sterile fashion using iodine swabs.  Nexplanon was inserted in typical fashion. There  were no immediate complications.  Pt tolerated procedure well. Pressure bandage applied to decrease bruising.  Patient given follow up instructions should she experience systemic or local signs/symptoms of infection in the next 24-48 hours.  Patient given Nexplanon pocket card.

## 2013-10-25 MED ORDER — ETONOGESTREL 68 MG ~~LOC~~ IMPL
68.0000 mg | DRUG_IMPLANT | Freq: Once | SUBCUTANEOUS | Status: AC
  Administered 2013-10-25: 68 mg via SUBCUTANEOUS

## 2013-10-25 NOTE — Addendum Note (Signed)
Addended by: Jone Baseman D on: 10/25/2013 08:50 AM   Modules accepted: Orders

## 2013-12-31 ENCOUNTER — Encounter: Payer: Self-pay | Admitting: Family Medicine

## 2013-12-31 ENCOUNTER — Ambulatory Visit (INDEPENDENT_AMBULATORY_CARE_PROVIDER_SITE_OTHER): Payer: Medicaid Other | Admitting: Family Medicine

## 2013-12-31 VITALS — BP 145/96 | HR 71 | Temp 98.0°F | Ht 68.0 in | Wt 200.0 lb

## 2013-12-31 DIAGNOSIS — J329 Chronic sinusitis, unspecified: Secondary | ICD-10-CM

## 2013-12-31 DIAGNOSIS — B9789 Other viral agents as the cause of diseases classified elsewhere: Secondary | ICD-10-CM

## 2013-12-31 NOTE — Patient Instructions (Addendum)
I would use 400mg  of ibuprofen every 6 hours for the body aches and sinus pressure. You can continue OTC cough/cold medicines but make sure they don't have ibuprofen (they can have acetaminophen/tylenol). Neti pot may help you as well.  Remember reasons to return-fever, getting better and then get worse, or persistent symptoms at 10 days. Call with any questions. The spot under your throat is an enlarged lymph node-all that means is your body is trying to fight this off. If that were to continue to expand, you could also see us back.   Health Maintenance Due  Topic Date Due  . Influenza Vaccine -can still get it when you are well but its less effective this year (20%)  07/06/2013     Sinusitis Sinusitis is redness, soreness, and puffiness (inflammation) of the air pockets in the bones of your face (sinuses). The redness, soreness, and puffiness can cause air and mucus to get trapped in your sinuses. This can allow germs to grow and cause an infection.  HOME CARE   Drink enough fluids to keep your pee (urine) clear or pale yellow.  Use a humidifier in your home.  Run a hot shower to create steam in the bathroom. Sit in the bathroom with the door closed. Breathe in the steam 3 4 times a day.  Put a warm, moist washcloth on your face 3 4 times a day, or as told by your doctor.  Use salt water sprays (saline sprays) to wet the thick fluid in your nose. This can help the sinuses drain.  Only take medicine as told by your doctor. GET HELP RIGHT AWAY IF:   Your pain gets worse.  You have very bad headaches.  You are sick to your stomach (nauseous).  You throw up (vomit).  You are very sleepy (drowsy) all the time.  Your face is puffy (swollen).  Your vision changes.  You have a stiff neck.  You have trouble breathing. MAKE SURE YOU:   Understand these instructions.  Will watch your condition.  Will get help right away if you are not doing well or get worse. Document  Released: 05/10/2008 Document Revised: 08/16/2012 Document Reviewed: 06/27/2012 [facility] Patient Information 2014 [place], [place].

## 2013-12-31 NOTE — Progress Notes (Signed)
  Erin ConchStephen Aubri Gathright, MD Phone: [phone]  Subjective:  Chief complaint-noted  Cold symptoms Achy all over and sinus tenderness are most bothersome symptom. Swollen neck gland most worrisome symptom. No fever. Endorses congestion and runny nose. 4 days of symptoms. Worst day so far has been today. Has tried dayquil/nyquil with little relief. Endorses sinus pressure. Yellow discharge at first now clear.  ROS-no nausa/vomiting/dental pain/pain in upper jaw with walking/pain with touching sinuses. No sore throat. Mild cough.   Past Medical History Patient Active Problem List   Diagnosis Date Noted  . Nexplanon in place 10/24/2013  . Carrier of group B Streptococcus 01/11/2012  . MOOD SWINGS 03/06/2009  . OVERWEIGHT 10/29/2008  . TOBACCO DEPENDENCE 02/02/2007  . OVARIAN CYST 02/02/2007    Medications- reviewed and updated Current Outpatient Prescriptions  Medication Sig Dispense Refill  . etonogestrel (IMPLANON) 68 MG IMPL implant Inject 1 each into the skin once. Placed 10/24/2013      . Prenatal Vit-Fe Fumarate-FA (PRENATAL MULTIVITAMIN) TABS Take 1 tablet by mouth every morning.        No current facility-administered medications for this visit.    Objective: BP 145/96  Pulse 71  Temp(Src) 98 F (36.7 C) (Oral)  Ht 5\' 8"  (1.727 m)  Wt 200 lb (90.719 kg)  BMI 30.42 kg/m2  SpO2 100% Gen: NAD,fatigued appearing TM: some bulging without erythema of TM, oropharynx normal, no sinus tenderness with palpation.  Neck: submental lymph node approximately 1.5 x 1.5 cm enlarged CV: RRR no murmurs rubs or gallops Lungs: CTAB no crackles, wheeze, rhonchi Abdomen: soft/nontender/nondistended/normal bowel sounds. Ext: no edema Skin: warm, dry Neuro: grossly normal, moves all extremities  Assessment/Plan:  Viral Sinusitis Body aches/sinus tenderness are most bothersome symptoms. Have advised q6 hour tylenol or ibuprofen. May continue OTC cold medicines. Can use Neti pot. F/u reasons  given specifically indications for concern of bacterial infection which may require treatment.

## 2014-04-04 ENCOUNTER — Ambulatory Visit (INDEPENDENT_AMBULATORY_CARE_PROVIDER_SITE_OTHER): Payer: BC Managed Care – PPO | Admitting: Family Medicine

## 2014-04-04 ENCOUNTER — Encounter: Payer: Self-pay | Admitting: Family Medicine

## 2014-04-04 VITALS — BP 139/99 | HR 78 | Temp 98.4°F | Ht 68.0 in | Wt 200.0 lb

## 2014-04-04 DIAGNOSIS — Z975 Presence of (intrauterine) contraceptive device: Secondary | ICD-10-CM

## 2014-04-04 DIAGNOSIS — N921 Excessive and frequent menstruation with irregular cycle: Secondary | ICD-10-CM

## 2014-04-04 MED ORDER — NORETHIN-ETH ESTRAD-FE BIPHAS 1 MG-10 MCG / 10 MCG PO TABS: 1.0000 | ORAL_TABLET | Freq: Every day | ORAL | Status: DC

## 2014-04-04 NOTE — Assessment & Plan Note (Signed)
-   likely related to nexplanon  - reassurance given  - rx of OCPs to take x 2 months and then stop  - discussion had as to reasoning  - f/u in 2 months to re-evaluate.

## 2014-04-04 NOTE — Patient Instructions (Signed)
Lets try giving you some birth control pills for the next couple months  Take them as directed on the package.

## 2014-04-04 NOTE — Progress Notes (Signed)
Subjective:     Patient ID: [name], female   DOB: 1986-12-05, 28 y.o.   MRN: [id number]  HPI  28 yo here for irregular bleeding  - has a nexplanon that was placed 10/2013.  - did well initially  - had no bleeding - this last month she had a period that lasted for 3 weeks - slow spotting that doesn't seem to go away - prior to the nexplanon had a 4 day period of bleeding like before  - now bleeding still persisting - doesn't fill more than a pad a day  No fevers, chills, lightheadedness, nausea, sob.    Review of Systems See above     Objective:   Physical Exam Filed Vitals:   04/04/14 1620  BP: 139/99  Pulse: 78  Temp: 98.4 F (36.9 C)  TempSrc: Oral  Height: 5\' 8"  (1.727 m)  Weight: 200 lb (90.719 kg)   Gen: WD, WN HEENT: no pallor PULM: normal effort CV: normal rate ABD: soft, NT GU: deferred     Assessment:     Irregular intermenstrual bleeding  Nexplanon in place       Plan:     - likely related to nexplanon - reassurance given - rx of OCPs to take x 2 months and then stop  - discussion had as to reasoning - f/u in 2 months to re-evaluate.   Vale HavenKeli L Nel Stoneking, MD

## 2014-04-25 ENCOUNTER — Encounter: Payer: Self-pay | Admitting: Emergency Medicine

## 2014-04-25 ENCOUNTER — Ambulatory Visit (INDEPENDENT_AMBULATORY_CARE_PROVIDER_SITE_OTHER): Payer: BC Managed Care – PPO | Admitting: Emergency Medicine

## 2014-04-25 VITALS — BP 139/90 | HR 75 | Ht 68.0 in | Wt 201.0 lb

## 2014-04-25 DIAGNOSIS — N898 Other specified noninflammatory disorders of vagina: Secondary | ICD-10-CM | POA: Insufficient documentation

## 2014-04-25 DIAGNOSIS — N921 Excessive and frequent menstruation with irregular cycle: Secondary | ICD-10-CM

## 2014-04-25 LAB — POCT WET PREP (WET MOUNT): CLUE CELLS WET PREP WHIFF POC: POSITIVE

## 2014-04-25 MED ORDER — METRONIDAZOLE 500 MG PO TABS: 500.0000 mg | ORAL_TABLET | Freq: Two times a day (BID) | ORAL | Status: DC

## 2014-04-25 MED ORDER — FLUCONAZOLE 150 MG PO TABS: 150.0000 mg | ORAL_TABLET | Freq: Once | ORAL | Status: DC

## 2014-04-25 NOTE — Patient Instructions (Signed)
It was nice to see you!  We are going to do the birth control pills for another month to see if that will finish clearing up the bleeding issues.  The wet prep showed that you have BV and a yeast infection. Take Flagyl 1 pill twice a day for 10 days. Once you have finished the Flagyl, take the diflucan pill.  Follow up in 1-2 months if you are still having bleeding issues.

## 2014-04-25 NOTE — Assessment & Plan Note (Addendum)
Wet prep shows BV and yeast. Flagyl 500mg  BID x10 days. Diflucan 150mg  PO x1.

## 2014-04-25 NOTE — Progress Notes (Signed)
   Subjective:    Patient ID: [name], female    DOB: Jan 24, 1986, 28 y.o.   MRN: [id number]  HPI [name] is here for followup menstrual bleeding problem.  She was seen about one month ago for frequent spotting on the Nexplanon. She was started on OCPs at that time. She states that the bleeding stopped for about 1 week, then restarted. For the last 2-3 weeks, she has been having daily light spotting. The spotting is light enough to not even require a pad. She also states that for the last 2-3 weeks she has had a foul-smelling vaginal discharge. This is associated with some vaginal itching. No fevers or pelvic pain. No new sexual partners.  Current Outpatient Prescriptions on File Prior to Visit  Medication Sig Dispense Refill  . etonogestrel (IMPLANON) 68 MG IMPL implant Inject 1 each into the skin once. Placed 10/24/2013      . Norethindrone-Ethinyl Estradiol-Fe Biphas (LO LOESTRIN FE) 1 MG-10 MCG / 10 MCG tablet Take 1 tablet by mouth daily.  1 Package  2  . Prenatal Vit-Fe Fumarate-FA (PRENATAL MULTIVITAMIN) TABS Take 1 tablet by mouth every morning.        No current facility-administered medications on file prior to visit.    I have reviewed and updated the following as appropriate: allergies and current medications SHx: former smoker  Review of Systems See HPI    Objective:   Physical Exam BP 139/90  Pulse 75  Ht 5\' 8"  (1.727 m)  Wt 201 lb (91.173 kg)  BMI 30.57 kg/m2  LMP 04/04/2014 Gen: alert, cooperative, NAD      Assessment & Plan:

## 2014-04-25 NOTE — Assessment & Plan Note (Signed)
Discussed options with patient. As she had some improvement after 1 month of OCPs, decided to continue OCPs for an additional month. F/u in 1-2 months if not improved.

## 2014-05-29 ENCOUNTER — Telehealth: Payer: Self-pay | Admitting: Family Medicine

## 2014-05-29 DIAGNOSIS — N898 Other specified noninflammatory disorders of vagina: Secondary | ICD-10-CM

## 2014-05-29 MED ORDER — FLUCONAZOLE 150 MG PO TABS: 150.0000 mg | ORAL_TABLET | Freq: Once | ORAL | Status: DC

## 2014-05-29 NOTE — Telephone Encounter (Signed)
Want to followup from last visit.  Need to know if there is something else that provider can assist her with this issue.

## 2014-05-29 NOTE — Telephone Encounter (Signed)
Called and spoke with Ms. [name].  She states she is having a white discharge again.  It does not smell or cause itching.  She also states she was recently on antibiotics for a dental procedure.  I have sent in a prescription for Diflucan to see if that will clear up the discharge.  It it does not, she will need to make an appointment.

## 2014-07-31 ENCOUNTER — Encounter: Payer: Self-pay | Admitting: Family Medicine

## 2014-07-31 ENCOUNTER — Ambulatory Visit (INDEPENDENT_AMBULATORY_CARE_PROVIDER_SITE_OTHER): Payer: BC Managed Care – PPO | Admitting: Family Medicine

## 2014-07-31 ENCOUNTER — Ambulatory Visit (HOSPITAL_COMMUNITY)
Admission: RE | Admit: 2014-07-31 | Discharge: 2014-07-31 | Disposition: A | Discharge status: Home / Self Care | Payer: BC Managed Care – PPO | Source: Ambulatory Visit | Attending: Family Medicine | Admitting: Family Medicine

## 2014-07-31 VITALS — BP 133/87 | HR 76 | Temp 98.0°F | Wt 204.0 lb

## 2014-07-31 DIAGNOSIS — M25579 Pain in unspecified ankle and joints of unspecified foot: Secondary | ICD-10-CM

## 2014-07-31 DIAGNOSIS — M79609 Pain in unspecified limb: Secondary | ICD-10-CM | POA: Diagnosis not present

## 2014-07-31 DIAGNOSIS — M545 Low back pain, unspecified: Secondary | ICD-10-CM | POA: Insufficient documentation

## 2014-07-31 DIAGNOSIS — M25569 Pain in unspecified knee: Secondary | ICD-10-CM

## 2014-07-31 DIAGNOSIS — M25561 Pain in right knee: Secondary | ICD-10-CM

## 2014-07-31 DIAGNOSIS — M25571 Pain in right ankle and joints of right foot: Secondary | ICD-10-CM
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
## 2014-07-31 NOTE — Assessment & Plan Note (Signed)
With point tenderness over right 4th MTP joint and increased pain with weight bearing, some concern for fracture.  - Right foot xray. - Use crutches and wear thick-soled work boot that pt states is comfortable.  - Return in 1-2 weeks if no improvement. At that time, could re-xray to f/u. - Tylenol/ibuprofen PRN.
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
## 2014-08-01 ENCOUNTER — Encounter: Payer: Self-pay | Admitting: Family Medicine

## 2014-08-02 ENCOUNTER — Telehealth: Payer: Self-pay | Admitting: Family Medicine

## 2014-08-02 NOTE — Telephone Encounter (Signed)
Pt called and would like someone to call her with her x-ray results. jw °

## 2014-08-06 NOTE — Telephone Encounter (Signed)
Please inform patient that her xray was negative. If she has further questions about this issue they should be directed to Dr. Benjamin Stain who saw her for this issue.

## 2014-08-06 NOTE — Telephone Encounter (Signed)
Pt informed of normal x-ray results. Will contact dr Benjamin Stain for further issues. Beatriz Quintela CMA

## 2014-08-22 ENCOUNTER — Encounter: Payer: Self-pay | Admitting: Family Medicine

## 2014-08-22 ENCOUNTER — Ambulatory Visit (INDEPENDENT_AMBULATORY_CARE_PROVIDER_SITE_OTHER): Payer: BC Managed Care – PPO | Admitting: Family Medicine

## 2014-08-22 VITALS — BP 138/92 | HR 80 | Temp 98.9°F | Wt 199.0 lb

## 2014-08-22 DIAGNOSIS — H547 Unspecified visual loss: Secondary | ICD-10-CM | POA: Diagnosis not present

## 2014-08-22 DIAGNOSIS — Z0100 Encounter for examination of eyes and vision without abnormal findings: Secondary | ICD-10-CM
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
## 2014-08-22 NOTE — Patient Instructions (Signed)
It was great seeing you today.   1. Please make an appointment with your primary care doctor Dr Richarda Blade if you have any additional paperwork needs  If you have any questions or concerns before then, please call the clinic at [phone].  Take Care,   Dr Wenda Low

## 2014-08-28 ENCOUNTER — Telehealth: Payer: Self-pay | Admitting: Family Medicine

## 2014-08-28 NOTE — Telephone Encounter (Signed)
Pt called and would like to speak to Dr. Richarda Blade concerning her menstrual cycle. jw

## 2014-08-29 NOTE — Telephone Encounter (Signed)
Left messge on vm informing patient that Dr. Richarda Blade wouldn't be in clinic until to tommorrow and to call back with more details.

## 2014-08-29 NOTE — Telephone Encounter (Signed)
Pt called back. She would really like to talk to Dr Richarda Blade

## 2014-09-02 NOTE — Telephone Encounter (Signed)
Was given b/c med for menstrual cycle regulation while on nexplanon.  Now patient have had cycle from August til present.  Need to discuss this with provider.

## 2014-09-03 NOTE — Telephone Encounter (Signed)
Patient has appt scheduled for 9/30 to discuss.

## 2014-09-03 NOTE — Telephone Encounter (Signed)
She will need to be seen in clinic to talk about this issue. Thanks

## 2014-09-04 ENCOUNTER — Ambulatory Visit: Payer: BC Managed Care – PPO | Admitting: Family Medicine

## 2014-09-09 ENCOUNTER — Ambulatory Visit (INDEPENDENT_AMBULATORY_CARE_PROVIDER_SITE_OTHER): Payer: BC Managed Care – PPO | Admitting: Family Medicine

## 2014-09-09 VITALS — BP 122/89 | HR 80 | Temp 98.5°F | Wt 198.8 lb

## 2014-09-09 DIAGNOSIS — R42 Dizziness and giddiness: Secondary | ICD-10-CM | POA: Diagnosis not present

## 2014-09-09 DIAGNOSIS — N921 Excessive and frequent menstruation with irregular cycle: Secondary | ICD-10-CM

## 2014-09-09 LAB — POCT HEMOGLOBIN: Hemoglobin: 12.4 g/dL (ref 12.2–16.2)

## 2014-09-09 NOTE — Patient Instructions (Signed)
I have scheduled you to come back in 1 month to take out the nexplanon and place a mirena. Until then you can use the birth control pills if you need to.

## 2014-09-30 ENCOUNTER — Encounter: Payer: Self-pay | Admitting: Family Medicine

## 2014-09-30 NOTE — Progress Notes (Signed)
   Subjective:    Patient ID: [name], female    DOB: November 21, 1986, 28 y.o.   MRN: [id number]  Dizziness   Pt presents for irregular menses and dizziness. Pt reports she has had implanon for close to 1 year and has had nearly constant bleeding with only a few week long breaks. She reports she feels very tired and sometimes dizzy or lightheaded. She came in for this several months ago and was given birth control pills which worked for a short time but then she stopped taking them. She wants implanon removed.   Review of Systems  Neurological: Positive for dizziness.   See HPI    Objective:   Physical Exam  Nursing note and vitals reviewed. Constitutional: She is oriented to person, place, and time. She appears well-developed and well-nourished. No distress.  HENT:  Head: Normocephalic and atraumatic.  Eyes: Conjunctivae are normal. Right eye exhibits no discharge. Left eye exhibits no discharge. No scleral icterus.  Cardiovascular: Normal rate, regular rhythm and normal heart sounds.   No murmur heard. Pulmonary/Chest: Effort normal and breath sounds normal. No respiratory distress. She has no wheezes.  Abdominal: She exhibits no distension.  Neurological: She is alert and oriented to person, place, and time.  Skin: Skin is warm and dry. She is not diaphoretic. No pallor.  Psychiatric: She has a normal mood and affect. Her behavior is normal.          Assessment & Plan:

## 2014-09-30 NOTE — Assessment & Plan Note (Signed)
Concern for symptomatic anemia but normal poc hgb. Advised against continuing pills and nexplanon together. Pt has become pregnant on pills alone and on depo. She is nervous about mirena but on further discussion this seems to be the best option. - schedule patient for nexplanon removal and mirena insertion

## 2014-10-07 ENCOUNTER — Encounter: Payer: Self-pay | Admitting: Family Medicine

## 2014-10-10 ENCOUNTER — Ambulatory Visit (INDEPENDENT_AMBULATORY_CARE_PROVIDER_SITE_OTHER): Payer: BC Managed Care – PPO | Admitting: Family Medicine

## 2014-10-10 ENCOUNTER — Encounter: Payer: Self-pay | Admitting: Family Medicine

## 2014-10-10 VITALS — BP 138/81 | HR 88 | Temp 98.3°F | Ht 68.0 in | Wt 201.3 lb

## 2014-10-10 DIAGNOSIS — Z3049 Encounter for surveillance of other contraceptives: Secondary | ICD-10-CM

## 2014-10-10 DIAGNOSIS — N921 Excessive and frequent menstruation with irregular cycle: Secondary | ICD-10-CM

## 2014-10-10 DIAGNOSIS — Z975 Presence of (intrauterine) contraceptive device: Secondary | ICD-10-CM

## 2014-10-10 DIAGNOSIS — Z30430 Encounter for insertion of intrauterine contraceptive device: Secondary | ICD-10-CM | POA: Diagnosis not present

## 2014-10-10 DIAGNOSIS — Z3046 Encounter for surveillance of implantable subdermal contraceptive: Secondary | ICD-10-CM

## 2014-10-10 MED ORDER — LEVONORGESTREL 20 MCG/24HR IU IUD
INTRAUTERINE_SYSTEM | Freq: Once | INTRAUTERINE | Status: AC
  Administered 2014-10-10: 1 via INTRAUTERINE

## 2014-10-10 NOTE — Patient Instructions (Signed)
Intrauterine Device Insertion Most often, an intrauterine device (IUD) is inserted into the uterus to prevent pregnancy. There are 2 types of IUDs available:  Copper IUD--This type of IUD creates an environment that is not favorable to sperm survival. The mechanism of action of the copper IUD is not known for certain. It can stay in place for 10 years.  Hormone IUD--This type of IUD contains the hormone progestin (synthetic progesterone). The progestin thickens the cervical mucus and prevents sperm from entering the uterus, and it also thins the uterine lining. There is no evidence that the hormone IUD prevents implantation. One hormone IUD can stay in place for up to 5 years, and a different hormone IUD can stay in place for up to 3 years. An IUD is the most cost-effective birth control if left in place for the full duration. It may be removed at any time. LET YOUR HEALTH CARE PROVIDER KNOW ABOUT:  Any allergies you have.  All medicines you are taking, including vitamins, herbs, eye drops, creams, and over-the-counter medicines.  Previous problems you or members of your family have had with the use of anesthetics.  Any blood disorders you have.  Previous surgeries you have had.  Possibility of pregnancy.  Medical conditions you have. RISKS AND COMPLICATIONS  Generally, intrauterine device insertion is a safe procedure. However, as with any procedure, complications can occur. Possible complications include:  Accidental puncture (perforation) of the uterus.  Accidental placement of the IUD either in the muscle layer of the uterus (myometrium) or outside the uterus. If this happens, the IUD can be found essentially floating around the bowels and must be taken out surgically.  The IUD may fall out of the uterus (expulsion). This is more common in women who have recently had a child.   Pregnancy in the fallopian tube (ectopic).  Pelvic inflammatory disease (PID), which is infection of  the uterus and fallopian tubes. The risk of PID is slightly increased in the first 20 days after the IUD is placed, but the overall risk is still very low. BEFORE THE PROCEDURE  Schedule the IUD insertion for when you will have your menstrual period or right after, to make sure you are not pregnant. Placement of the IUD is better tolerated shortly after a menstrual cycle.  You may need to take tests or be examined to make sure you are not pregnant.  You may be required to take a pregnancy test.  You may be required to get checked for sexually transmitted infections (STIs) prior to placement. Placing an IUD in someone who has an infection can make the infection worse.  You may be given a pain reliever to take 1 or 2 hours before the procedure.  An exam will be performed to determine the size and position of your uterus.  Ask your health care provider about changing or stopping your regular medicines. PROCEDURE   A tool (speculum) is placed in the vagina. This allows your health care provider to see the lower part of the uterus (cervix).  The cervix is prepped with a medicine that lowers the risk of infection.  You may be given a medicine to numb each side of the cervix (intracervical or paracervical block). This is used to block and control any discomfort with insertion.  A tool (uterine sound) is inserted into the uterus to determine the length of the uterine cavity and the direction the uterus may be tilted.  A slim instrument (IUD inserter) is inserted through the cervical   canal and into your uterus.  The IUD is placed in the uterine cavity and the insertion device is removed.  The nylon string that is attached to the IUD and used for eventual IUD removal is trimmed. It is trimmed so that it lays high in the vagina, just outside the cervix. AFTER THE PROCEDURE  You may have bleeding after the procedure. This is normal. It varies from light spotting for a few days to menstrual-like  bleeding.  You may have mild cramping. Document Released: 07/21/2011 Document Revised: 09/12/2013 Document Reviewed: 05/13/2013 [facility] Patient Information 2015 [place], [place]. This information is not intended to replace advice given to you by your health care provider. Make sure you discuss any questions you have with your health care provider.  -endometritis -genital or pelvic infection now or in the past -have more than one sexual partner or your partner has more than one partner -heart disease -history of an ectopic or tubal pregnancy -immune system problems -IUD in place -liver disease or tumor -problems with blood clots or take blood-thinners -use intravenous drugs -uterus of unusual shape -vaginal bleeding that has not been explained -an unusual or allergic reaction to levonorgestrel, other hormones, silicone, or polyethylene, medicines, foods, dyes, or preservatives -pregnant or trying to get pregnant -breast-feeding How should I use this medicine? This device is placed inside the uterus by a health care professional. Talk to your pediatrician regarding the use of this medicine in children. Special care may be needed. Overdosage: If you think you have taken too much of this medicine contact a poison control center or emergency room at once. NOTE: This medicine is only for you. Do not share this medicine with others. What if I miss a dose? This does not apply. What may interact with this medicine? Do not take this medicine with any of the following medications: -amprenavir -bosentan -fosamprenavir This medicine may also interact with the following medications: -aprepitant -barbiturate medicines for inducing sleep or treating seizures -bexarotene -griseofulvin -medicines to treat seizures like carbamazepine, ethotoin, felbamate, oxcarbazepine, phenytoin, topiramate -modafinil -pioglitazone -rifabutin -rifampin -rifapentine -some medicines to treat HIV infection  like atazanavir, indinavir, lopinavir, nelfinavir, tipranavir, ritonavir -St. John's wort -warfarin This list may not describe all possible interactions. Give your health care provider a list of all the medicines, herbs, non-prescription drugs, or dietary supplements you use. Also tell them if you smoke, drink alcohol, or use illegal drugs. Some items may interact with your medicine. What should I watch for while using this medicine? Visit your doctor or health care professional for regular check ups. See your doctor if you or your partner has sexual contact with others, becomes HIV positive, or gets a sexual transmitted disease. This product does not protect you against HIV infection (AIDS) or other sexually transmitted diseases. You can check the placement of the IUD yourself by reaching up to the top of your vagina with clean fingers to feel the threads. Do not pull on the threads. It is a good habit to check placement after each menstrual period. Call your doctor right away if you feel more of the IUD than just the threads or if you cannot feel the threads at all. The IUD may come out by itself. You may become pregnant if the device comes out. If you notice that the IUD has come out use a backup birth control method like condoms and call your health care provider. Using tampons will not change the position of the IUD and are okay to use during  your period. What side effects may I notice from receiving this medicine? Side effects that you should report to your doctor or health care professional as soon as possible: -allergic reactions like skin rash, itching or hives, swelling of the face, lips, or tongue -fever, flu-like symptoms -genital sores -high blood pressure -no menstrual period for 6 weeks during use -pain, swelling, warmth in the leg -pelvic pain or tenderness -severe or sudden headache -signs of pregnancy -stomach cramping -sudden shortness of breath -trouble with balance,  talking, or walking -unusual vaginal bleeding, discharge -yellowing of the eyes or skin Side effects that usually do not require medical attention (report to your doctor or health care professional if they continue or are bothersome): -acne -breast pain -change in sex drive or performance -changes in weight -cramping, dizziness, or faintness while the device is being inserted -headache -irregular menstrual bleeding within first 3 to 6 months of use -nausea This list may not describe all possible side effects. Call your doctor for medical advice about side effects. You may report side effects to FDA at 1-800-FDA-1088. Where should I keep my medicine? This does not apply. NOTE: This sheet is a summary. It may not cover all possible information. If you have questions about this medicine, talk to your doctor, pharmacist, or health care provider.  2015, [employer] Standard. (2011-12-23 13:54:04)

## 2014-10-10 NOTE — Assessment & Plan Note (Signed)
Removed nexplanon and inserted mirena, f/u in 2 weeks

## 2014-10-10 NOTE — Progress Notes (Signed)
   Subjective:    Patient ID: [name], female    DOB: 03-07-1986, 28 y.o.   MRN: [id number]  HPI Pt presents for removal of nexplanon and insertion of mirena. Pt has had the nexplanon for 1 year with frequent/irregular bleeding throughout that time. Has had her period for most of the past 12 months with a few week-long breaks in that time.   Review of Systems  Constitutional: Positive for fatigue. Negative for unexpected weight change.  Cardiovascular: Negative for chest pain.  Genitourinary: Positive for vaginal bleeding and menstrual problem. Negative for vaginal discharge and vaginal pain.  Neurological: Positive for dizziness, weakness and light-headedness.       Objective:   Physical Exam  Constitutional: She is oriented to person, place, and time. She appears well-developed and well-nourished. No distress.  HENT:  Head: Normocephalic and atraumatic.  Eyes: Conjunctivae are normal. Right eye exhibits no discharge. Left eye exhibits no discharge. No scleral icterus.  Cardiovascular: Normal rate.   Pulmonary/Chest: Effort normal.  Abdominal: She exhibits no distension.  Genitourinary: Vagina normal and uterus normal. No labial fusion. There is no rash, tenderness, lesion or injury on the right labia. There is no rash, tenderness, lesion or injury on the left labia. Uterus is not deviated, not enlarged, not fixed and not tender. Cervix exhibits no motion tenderness, no discharge and no friability. No erythema, tenderness or bleeding in the vagina. No foreign body around the vagina. No signs of injury around the vagina. No vaginal discharge found.  Neurological: She is alert and oriented to person, place, and time.  Skin: Skin is warm and dry. No rash noted. She is not diaphoretic.  Psychiatric: She has a normal mood and affect. Her behavior is normal.  Nursing note and vitals reviewed.         Assessment & Plan:  PROCEDURE NOTE: NEXPLANON  REMOVAL Patient given informed  consent and signed copy in the chart. left arm area prepped and draped in the usual sterile fashion. Three cc of lidocaine without epinephrine 1% used for local anesthesia. A small stab incision was made close to the nexplanon with scalpel. Hemostats were used to withdraw the nexplanon. A small bandage was applied over a steri strip  No complications.Patient given follow up instructions should she experience redness, swelling at sight or fever in the next 24 hours. Patient was reminded this totally removes her nexplanon contraceptive devise. (she can now potentially conceive)  IUD INSERTION: Patient given informed consent, signed copy in the chart..  Negative pregnancy confirmed.  Appropriate time out taken.   Sterile instruments and technique was used. Cervix brought into view with use of speculum and then cleansed three times with  betadine swabs.  A tenaculum was placed into the anterior lip of the cervix and a uterine sound was used to measure uterine size.   A mirena IUD was placed into the endometrial cavity, deployed and secured. The applicator was removed. The strings were trimmed to 2 centimeters.   There were no complications and the patient tolerated the procedure well.   She was given handouts for post procedure instructions and information about the IUD including a card with the time of recommended removal.

## 2014-10-10 NOTE — Assessment & Plan Note (Signed)
Removed today

## 2014-10-22 ENCOUNTER — Telehealth: Payer: Self-pay | Admitting: *Deleted

## 2014-10-22 NOTE — Telephone Encounter (Signed)
Medication refill request via fax for Lo Loestrin FE 1-10 Tablet

## 2014-10-23 NOTE — Telephone Encounter (Signed)
I placed an IUD in this patient earlier this month so she should not continue to take the birth control pills. Her bleeding should resolve but it is not safe for her to keep using multiple hormones simultaneously. If she has further questions she should come discuss it with me or another red team provider in clinic.

## 2014-10-23 NOTE — Telephone Encounter (Signed)
Spoke with patient, she verifies that she is no longer taking this medication

## 2014-10-29 ENCOUNTER — Telehealth: Payer: Self-pay | Admitting: *Deleted

## 2014-10-29 NOTE — Telephone Encounter (Signed)
Received refill request from pharmacy for Lo Loestrin Fe 1-10 tablet.  Last refilled 09/04/14.  Med not listed in Meds & Orders.  Will route refill request to Dr. Richarda BladeAdamo.  Altamese Dilling~Johnell Landowski, BSN, RN-BC

## 2014-10-30 NOTE — Telephone Encounter (Signed)
The patient now has an IUD and does not need OCPs anymore. If she is still having bleeding she should come in and we can discuss it further. Thanks.

## 2014-10-30 NOTE — Telephone Encounter (Signed)
Called patient and she states she still has IUD and does not need refill of OCPs.  Altamese Dilling~Jeannette Richardson, BSN, RN-BC

## 2014-11-11 ENCOUNTER — Ambulatory Visit: Payer: BC Managed Care – PPO | Admitting: Family Medicine

## 2015-01-01 ENCOUNTER — Telehealth: Payer: Self-pay | Admitting: Family Medicine

## 2015-01-01 NOTE — Telephone Encounter (Signed)
Encounter was closed by mistake, please see previous documentation.
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
## 2015-01-02 NOTE — Telephone Encounter (Signed)
Pt calling back to check status of paperwork, [employer] faxed it yesterday, was placed on Erins desk (front office), the forms are not the original that was filled out, [employer]V sent blank forms to be filled out again. Dr. Gayla DossJoyner is the one that originally filled them out. Pt wants to know if he can do this again. Placing in Dr. Whitman HeroJoyner's box.

## 2015-01-03 ENCOUNTER — Encounter: Payer: Self-pay | Admitting: Family Medicine

## 2015-03-20 ENCOUNTER — Encounter: Payer: Self-pay | Admitting: Family Medicine

## 2015-03-20 ENCOUNTER — Ambulatory Visit (INDEPENDENT_AMBULATORY_CARE_PROVIDER_SITE_OTHER): Payer: BLUE CROSS/BLUE SHIELD | Admitting: Family Medicine

## 2015-03-20 VITALS — BP 124/72 | HR 77 | Temp 98.3°F | Ht 68.0 in | Wt 211.6 lb

## 2015-03-20 DIAGNOSIS — R51 Headache: Secondary | ICD-10-CM | POA: Diagnosis not present

## 2015-03-20 DIAGNOSIS — G8929 Other chronic pain: Secondary | ICD-10-CM

## 2015-03-20 MED ORDER — CYCLOBENZAPRINE HCL 10 MG PO TABS: 10.0000 mg | ORAL_TABLET | Freq: Three times a day (TID) | ORAL | Status: DC | PRN

## 2015-03-20 NOTE — Progress Notes (Signed)
   Subjective:    Patient ID: [name], female    DOB: 09/05/1986, 29 y.o.   MRN: [id number]  HPI  CC: headache  # Headache/migraine:  Has had "migraines" for years  Getting worse recently, no exact timeframe. Used it get 30 minutes of headaches, then 4-6 hours, now seems to last for days.   Used to be 1-2 a month, now seems like 2-3 times a week.  Tried taking: ibuprofen, aleve; helps somewhat. Tylenol does not help.  Headaches the past 2 days seem to be starting in her upper back and neck (right side) and then going into her head  Yesterday she had a worse episode where her right vision changed into a "pattern" that she said was similar to the wallpaper in the exam room, stayed there, vision not black, but has resolved by today  She has seen a chiropractor and had x-rays, told her neck did not curve like it was supposed to and has pinched nerves. Sometimes chiropractor is able to resolve her headaches completely, sometimes not ROS: no nausea or vomiting, no dizziness, no fevers/chills, no weakness or numbness   Review of Systems   See HPI for ROS. All other systems reviewed and are negative.  Past medical history, surgical, family, and social history reviewed and updated in the EMR as appropriate. Objective:  BP 124/72 mmHg  Pulse 77  Temp(Src) 98.3 F (36.8 C) (Oral)  Ht 5\' 8"  (1.727 m)  Wt 211 lb 9 oz (95.964 kg)  BMI 32.18 kg/m2 Vitals and nursing note reviewed  General: NAD Eyes: PERRL, EOMI. CV: RRR, normal s1 and s2, no mrg Resp: CTAB normal effort Neck: tender and tight trapezius on the right Neuro: alert and oriented, no focal deficits. CN2-12 normal. Strength 5/5 bilaterally grip, arm, LE, foot. 1+ achilles bilaterally, absent patellar bilaterally. Finger to nose testing normal. No pronator drift. Heel to shin normal. Visual acuity 20/50 on left, 20/25 on right (both 20/25).   Assessment & Plan:  See Problem List Documentation

## 2015-03-20 NOTE — Patient Instructions (Signed)

## 2015-03-21 DIAGNOSIS — R519 Headache, unspecified: Secondary | ICD-10-CM | POA: Insufficient documentation

## 2015-03-21 DIAGNOSIS — R51 Headache: Secondary | ICD-10-CM

## 2015-03-21 NOTE — Assessment & Plan Note (Signed)
Chronic, worsened recently. DDx migraine, cervical/MSK HA, caffeine withdrawal, chronic sleep deprivation. Most concerning symptom being right sided vision change, at same time had occipital headache, both of which resolved today. She is tender in her neck with some spasm. 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 Would not want to start prophylactic treatment for migraine today, will give flexeril to help with neck spasm, pt to f/u 1-2 months with daily headache log to assist with possible initiation of prophylaxis.
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
## 2016-01-29 ENCOUNTER — Ambulatory Visit: Payer: BLUE CROSS/BLUE SHIELD | Admitting: Family Medicine

## 2016-07-05 ENCOUNTER — Ambulatory Visit (INDEPENDENT_AMBULATORY_CARE_PROVIDER_SITE_OTHER): Payer: BLUE CROSS/BLUE SHIELD | Admitting: Family Medicine

## 2016-07-05 ENCOUNTER — Other Ambulatory Visit (HOSPITAL_COMMUNITY)
Admission: RE | Admit: 2016-07-05 | Discharge: 2016-07-05 | Disposition: A | Discharge status: Home / Self Care | Payer: BLUE CROSS/BLUE SHIELD | Source: Ambulatory Visit | Attending: Family Medicine | Admitting: Family Medicine

## 2016-07-05 ENCOUNTER — Encounter: Payer: Self-pay | Admitting: Family Medicine

## 2016-07-05 VITALS — BP 126/86 | HR 82 | Temp 98.3°F | Ht 68.0 in | Wt 231.8 lb

## 2016-07-05 DIAGNOSIS — Z01419 Encounter for gynecological examination (general) (routine) without abnormal findings: Secondary | ICD-10-CM | POA: Insufficient documentation

## 2016-07-05 DIAGNOSIS — Z124 Encounter for screening for malignant neoplasm of cervix: Secondary | ICD-10-CM

## 2016-07-05 DIAGNOSIS — Z1151 Encounter for screening for human papillomavirus (HPV): Secondary | ICD-10-CM | POA: Insufficient documentation

## 2016-07-05 DIAGNOSIS — K625 Hemorrhage of anus and rectum: Secondary | ICD-10-CM

## 2016-07-05 LAB — HEMOCCULT GUIAC POC 1CARD (OFFICE): FECAL OCCULT BLD: NEGATIVE

## 2016-07-05 MED ORDER — PSYLLIUM 55.46 % PO POWD: 1.0000 | Freq: Two times a day (BID) | ORAL | 0 refills | Status: DC

## 2016-07-05 NOTE — Patient Instructions (Addendum)
It was a pleasure seeing you today in our clinic. Today we discussed your rectal bleeding. Here is the treatment plan we have discussed and agreed upon together:   - I would like for you to start taking Metamucil. I have called you and a prescription for this but depending on price it may be less expensive purchasing the over the counter supply. Take this as directed on the bottle twice a day. This should help loosen her stools to reduce the amount of straining you may have during bowel movements. - I referred you to gastroenterology. You'll be contacted later this week to have set up an appointment.

## 2016-07-05 NOTE — Progress Notes (Addendum)
   HPI  CC: Physical exam and hematochezia Patient is here for a physical exam and Pap smear. She states that she has been doing overall well. She endorses episodic hematochezia but otherwise feels well. She denies any systemic symptoms at this time.  Hematochezia Patient states that she is been experiencing hematochezia since the birth of her last child. At that time she states that she knew she had hemorrhoids but the bleeding gradually improved and the hemorrhoids seem to go away. She states that she occasionally has hemorrhoidal flares but has not had one in some time. She states that she will have painless bright red blood per rectum irregularly. She does not think this is hemorrhoidal. The bleeding will last for around 3 days and then get better. Bleeding will be significant enough to fill the entire bowl with blood. She was unable to come up with a frequency in which these episodes occur (weekly, monthly, annually, etc). She has not noticed any specific pattern in which this occurs. She denies any significant abdominal pain. She denies any hematemesis. She denies any vaginal bleeding. She states that she has no family history of colon cancer, ulcerative colitis, or Crohn's. She denies any symptoms of orthostatic hypotension, dizziness, blurred vision, lightheadedness, weakness, fatigue, shortness of breath, chest pain.  Review of Systems   See HPI for ROS. All other systems reviewed and are negative.  CC, SH/smoking status, and VS noted  Objective: BP 126/86   Pulse 82   Temp 98.3 F (36.8 C) (Oral)   Ht 5\' 8"  (1.727 m)   Wt 231 lb 12.8 oz (105.1 kg)   LMP 06/08/2016 (Exact Date)   BMI 35.25 kg/m  Gen: NAD, alert, cooperative, and pleasant. HEENT: NCAT, EOMI, PERRL CV: RRR, no murmur Resp: CTAB, no wheezes, non-labored Abd: SNTND, BS present, no guarding or organomegaly Ext: No edema, warm Neuro: Alert and oriented, Speech clear, No gross deficits Female genitalia: normal  external genitalia, vulva, vagina, cervix, uterus and adnexa  Rectal exam: negative without mass or tenderness. Skin tag noted @6  o'clock otherwise unremarkable.  Assessment and plan:  Rectal bleeding Patient is here with complaints of bright red blood per rectum, painless. Differential includes internal hemorrhoids versus diverticulosis versus AVM versus neoplasm versus autoimmune versus infectious. No significant findings on exam. - Metamucil twice a day - Encourage not to strain - Referral to gastroenterology   Orders Placed This Encounter  Procedures  . Ambulatory referral to Gastroenterology    Referral Priority:   Routine    Referral Type:   Consultation    Referral Reason:   Specialty Services Required    Number of Visits Requested:   1  . Hemoccult - 1 Card (office)    Meds ordered this encounter  Medications  . Psyllium 55.46 % POWD    Sig: Take 1 Dose by mouth 2 (two) times daily.    Dispense:  432 g    Refill:  0     Kathee Delton, MD,MS,  PGY3 07/05/2016 6:31 PM

## 2016-07-05 NOTE — Assessment & Plan Note (Signed)
Patient is here with complaints of bright red blood per rectum, painless. Differential includes internal hemorrhoids versus diverticulosis versus AVM versus neoplasm versus autoimmune versus infectious. No significant findings on exam. - Metamucil twice a day - Encourage not to strain - Referral to gastroenterology

## 2016-07-09 LAB — CYTOLOGY - PAP

## 2016-09-13 ENCOUNTER — Ambulatory Visit (INDEPENDENT_AMBULATORY_CARE_PROVIDER_SITE_OTHER): Payer: BLUE CROSS/BLUE SHIELD | Admitting: Family Medicine

## 2016-09-13 ENCOUNTER — Other Ambulatory Visit (HOSPITAL_COMMUNITY)
Admission: RE | Admit: 2016-09-13 | Discharge: 2016-09-13 | Disposition: A | Discharge status: Home / Self Care | Payer: BLUE CROSS/BLUE SHIELD | Source: Ambulatory Visit | Attending: Family Medicine | Admitting: Family Medicine

## 2016-09-13 ENCOUNTER — Encounter: Payer: Self-pay | Admitting: Family Medicine

## 2016-09-13 VITALS — BP 141/88 | HR 81 | Temp 98.6°F | Ht 68.0 in | Wt 226.2 lb

## 2016-09-13 DIAGNOSIS — Z113 Encounter for screening for infections with a predominantly sexual mode of transmission: Secondary | ICD-10-CM | POA: Diagnosis present

## 2016-09-13 DIAGNOSIS — Z202 Contact with and (suspected) exposure to infections with a predominantly sexual mode of transmission: Secondary | ICD-10-CM | POA: Diagnosis not present

## 2016-09-13 DIAGNOSIS — Z20828 Contact with and (suspected) exposure to other viral communicable diseases: Secondary | ICD-10-CM | POA: Diagnosis not present

## 2016-09-13 LAB — POCT WET PREP (WET MOUNT)
Clue Cells Wet Prep Whiff POC: NEGATIVE
Trichomonas Wet Prep HPF POC: ABSENT

## 2016-09-13 NOTE — Assessment & Plan Note (Signed)
Patient is here with possible exposure to STD. She states that she recently found out that her partner of 3 years has been sleeping with another woman of whom states that she has a history of herpes and other previous vaginal infections. Patient has not had any significant symptoms but states that she occasionally has some vaginal discharge which she has some today. No other symptoms. No vaginal itching or pain. No lesions. - Wet prep today: Benign (possible BV but patient is relatively asymptomatic) - GC chlamydia pending - HIV and RPR ordered but lab had already closed. Patient states she will come back tomorrow to have blood tests obtained. - Informed patient that I would mail her normal results or contact her personally if there were any positive findings and needed to be discussed. I also informed her that if she had a significant amount of anxiety towards these results she could contact me sooner asking me for these results.

## 2016-09-13 NOTE — Patient Instructions (Signed)
It was a pleasure seeing you today in our clinic. Today we did some labs. Here is the treatment plan we have discussed and agreed upon together:   - I will contact you between 6 and 6:30 pm today if there are any positive results from today's wet prep that need to be discussed or treated. - It will take 5-7 days to hear back about the gonorrhea/chlamydia test. - Come back anytime this week to have blood drawn. The 2 tests I've ordered test you for syphilis and HIV. These labs typically take 3-5 days to result. - I will contact you by Friday of this week with any positive lab results. Assuming everything is negative I will mail you these results which should arrive sometime next week. - If you have any questions at all or the results of these labs are causing a significant amount of anxiety do not hesitate to call our office and leave a message for me with my staff.

## 2016-09-13 NOTE — Progress Notes (Signed)
VAGINAL DISCHARGE States that she has always had a slight discharge. Was told that her partner of 3 yrs had been seeing another woman. After discussion about this she was told by this other woman that she (the other woman) had herpes and a h/o other vaginal infections.  Discharge is: normal for her Sex in last month: September 27 Using barrier protection (condoms): no Possible STD exposure: yes Personal history of vaginal infection: BV Family history of uterine or vaginal cancer: no Recent antibiotic use: no  Symptoms Vaginal itching: occasional; more recently Dysuria: no Dyspareunia: no Genital sores or ulcers:no Hematuria: no Flank pain: no Weight loss: no Weight gain: no Trouble with vision: no Headaches: no Abdomen or pelvic pain: occasional (usually from menstrual cycle) Back pain: no   ROS see HPI Smoking Status noted   CC, SH/smoking status, and VS noted  Objective: BP (!) 141/88   Pulse 81   Temp 98.6 F (37 C) (Oral)   Ht 5\' 8"  (1.727 m)   Wt 226 lb 3.2 oz (102.6 kg)   BMI 34.39 kg/m  Gen: NAD, alert, cooperative. CV: Well-perfused. Resp: Non-labored. Neuro: Sensation intact throughout. Female genitalia: normal external genitalia, vulva, vagina, cervix, uterus and adnexa   Assessment and plan:  Possible exposure to STD Patient is here with possible exposure to STD. She states that she recently found out that her partner of 3 years has been sleeping with another woman of whom states that she has a history of herpes and other previous vaginal infections. Patient has not had any significant symptoms but states that she occasionally has some vaginal discharge which she has some today. No other symptoms. No vaginal itching or pain. No lesions. - Wet prep today: Benign (possible BV but patient is relatively asymptomatic) - GC chlamydia pending - HIV and RPR ordered but lab had already closed. Patient states she will come back tomorrow to have blood tests  obtained. - Informed patient that I would mail her normal results or contact her personally if there were any positive findings and needed to be discussed. I also informed her that if she had a significant amount of anxiety towards these results she could contact me sooner asking me for these results.   Orders Placed This Encounter  Procedures  . RPR  . HIV antibody  . POCT Wet Prep [facility])    Kathee DeltonIan D Pallas Wahlert, MD,MS,  PGY3 09/13/2016 6:30 PM

## 2016-09-14 LAB — CERVICOVAGINAL ANCILLARY ONLY
Chlamydia: NEGATIVE
Neisseria Gonorrhea: NEGATIVE

## 2016-09-15 ENCOUNTER — Other Ambulatory Visit: Payer: BLUE CROSS/BLUE SHIELD

## 2016-09-16 LAB — RPR

## 2016-09-16 LAB — HIV ANTIBODY (ROUTINE TESTING W REFLEX): HIV 1&2 Ab, 4th Generation: NONREACTIVE

## 2016-09-17 ENCOUNTER — Encounter (HOSPITAL_BASED_OUTPATIENT_CLINIC_OR_DEPARTMENT_OTHER): Payer: Self-pay | Admitting: Family Medicine

## 2017-01-07 ENCOUNTER — Telehealth: Payer: Self-pay | Admitting: Family Medicine

## 2017-01-07 NOTE — Telephone Encounter (Signed)
Was last seen in clinic in nov 2017.  She now has a yellowish green discharge. No burning.  This started after her last cycle on jan 17.  Please advise

## 2017-01-07 NOTE — Telephone Encounter (Signed)
Spoke with pt and scheduled her a same day appt.Deseree Bruna PotterBlount, CMA

## 2017-01-10 ENCOUNTER — Ambulatory Visit: Payer: BLUE CROSS/BLUE SHIELD | Admitting: Family Medicine

## 2017-02-09 ENCOUNTER — Ambulatory Visit (INDEPENDENT_AMBULATORY_CARE_PROVIDER_SITE_OTHER): Payer: BLUE CROSS/BLUE SHIELD | Admitting: Family Medicine

## 2017-02-09 ENCOUNTER — Other Ambulatory Visit (HOSPITAL_COMMUNITY)
Admission: RE | Admit: 2017-02-09 | Discharge: 2017-02-09 | Disposition: A | Discharge status: Home / Self Care | Payer: BLUE CROSS/BLUE SHIELD | Source: Ambulatory Visit | Attending: Family Medicine | Admitting: Family Medicine

## 2017-02-09 ENCOUNTER — Encounter: Payer: Self-pay | Admitting: Family Medicine

## 2017-02-09 VITALS — BP 118/70 | HR 90 | Temp 99.0°F | Ht 68.0 in | Wt 224.0 lb

## 2017-02-09 DIAGNOSIS — R51 Headache: Secondary | ICD-10-CM | POA: Diagnosis not present

## 2017-02-09 DIAGNOSIS — Z113 Encounter for screening for infections with a predominantly sexual mode of transmission: Secondary | ICD-10-CM | POA: Diagnosis not present

## 2017-02-09 DIAGNOSIS — N898 Other specified noninflammatory disorders of vagina: Secondary | ICD-10-CM | POA: Diagnosis not present

## 2017-02-09 DIAGNOSIS — G8929 Other chronic pain: Secondary | ICD-10-CM

## 2017-02-09 DIAGNOSIS — R109 Unspecified abdominal pain: Secondary | ICD-10-CM | POA: Diagnosis not present

## 2017-02-09 LAB — POCT WET PREP (WET MOUNT)
CLUE CELLS WET PREP WHIFF POC: NEGATIVE
TRICHOMONAS WET PREP HPF POC: ABSENT

## 2017-02-09 LAB — POCT URINALYSIS DIPSTICK
BILIRUBIN UA: NEGATIVE
Blood, UA: NEGATIVE
GLUCOSE UA: NEGATIVE
KETONES UA: NEGATIVE
LEUKOCYTES UA: NEGATIVE
Nitrite, UA: NEGATIVE
Protein, UA: NEGATIVE
SPEC GRAV UA: 1.02
Urobilinogen, UA: 1
pH, UA: 7

## 2017-02-09 MED ORDER — METRONIDAZOLE 500 MG PO TABS: 500.0000 mg | ORAL_TABLET | Freq: Two times a day (BID) | ORAL | 0 refills | Status: DC

## 2017-02-09 NOTE — Patient Instructions (Signed)
It was a pleasure seeing you today in our clinic. Today we discussed your vaginal discharge. Here is the treatment plan we have discussed and agreed upon together:   - From our sample we obtained today it looks as though you have some bacterial vaginosis (BV) - I've called you and a prescription for metronidazole. Please take this to its completion. - This time I do not believe that your IUD is causing many of your symptoms. I think it is important to have this removed upon its expiration later this year but I do not feel, at this time, that there is any indication to remove it now.

## 2017-02-09 NOTE — Assessment & Plan Note (Signed)
Patient is here with complaints of vaginal discharge and abdominal cramping. Wet prep noted for clue cells consistent with BV. GC/Chlamydia pending. - Metronidazole twice a day 7 days - Adequate hydration discussed. - Will send patient printed results (assuming negative; otherwise will call) - Discussed appropriate vaginal hygiene

## 2017-02-09 NOTE — Assessment & Plan Note (Signed)
Patient had also reported some issues with headaches. They appear to be not overly debilitating, but do last a significant amount of time. - I've asked patient to create a headache diary. - Discussed NSAID use for symptom control. - Adequate hydration for prophylaxis. - I've asked patient to follow-up as needed if headaches persist. (if so, consider labs at that time)

## 2017-02-09 NOTE — Progress Notes (Signed)
   HPI  CC: VAGINAL DISCHARGE Patient is complaining of vaginal discharge and abdominal cramping. She states that she has had this discharge for the past 2 months. Discharges is thin and "orangeish". She denies any recent sexual activity (>552mths). Patient believes that her IUD may be causing this issue. She endorses no previous issues with her IUD. IUD has been in place for >2.7338yrs.   Having vaginal discharge for 2 months. Discharge is: thin and "orange-ish" Sex in last month: no Using barrier protection (condoms): no Possible STD exposure: unknown Personal history of vaginal infection: yes; chlamydia  Family history of uterine or vaginal cancer: no Recent antibiotic use: no  Symptoms Vaginal itching: some Dysuria: no Dyspareunia: no Genital sores or ulcers: no Hematuria: no Flank pain: some Weight loss: no Weight gain: no Trouble with vision: no Headaches: yes Abdomen or pelvic pain: yes Back pain: some.   ROS see HPI Smoking Status noted  CC, SH/smoking status, and VS noted  Objective: BP 118/70   Pulse 90   Temp 99 F (37.2 C) (Oral)   Ht 5\' 8"  (1.727 m)   Wt 224 lb (101.6 kg)   SpO2 98%   BMI 34.06 kg/m  Gen: NAD, alert, cooperative, and pleasant. CV: RRR, no murmur Resp: CTAB, no wheezes, non-labored Abd: SNTND, BS present, no guarding or organomegaly Female genitalia: normal external genitalia, vulva, vagina, cervix, uterus and adnexa Ext: No edema, warm Neuro: Alert and oriented, Speech clear, No gross deficits   Assessment and plan:  Vaginal discharge Patient is here with complaints of vaginal discharge and abdominal cramping. Wet prep noted for clue cells consistent with BV. GC/Chlamydia pending. - Metronidazole twice a day 7 days - Adequate hydration discussed. - Will send patient printed results (assuming negative; otherwise will call) - Discussed appropriate vaginal hygiene  Headache Patient had also reported some issues with headaches.  They appear to be not overly debilitating, but do last a significant amount of time. - I've asked patient to create a headache diary. - Discussed NSAID use for symptom control. - Adequate hydration for prophylaxis. - I've asked patient to follow-up as needed if headaches persist. (if so, consider labs at that time)   Orders Placed This Encounter  Procedures  . Urinalysis Dipstick  . POCT Wet Prep [facility]([place] [place])    Meds ordered this encounter  Medications  . metroNIDAZOLE (FLAGYL) 500 MG tablet    Sig: Take 1 tablet (500 mg total) by mouth 2 (two) times daily.    Dispense:  14 tablet    Refill:  0     Kathee DeltonIan D McKeag, MD,MS,  PGY3 02/09/2017 2:52 PM

## 2017-02-10 LAB — CERVICOVAGINAL ANCILLARY ONLY
CHLAMYDIA, DNA PROBE: NEGATIVE
NEISSERIA GONORRHEA: NEGATIVE

## 2017-02-14 ENCOUNTER — Encounter: Payer: Self-pay | Admitting: Family Medicine

## 2017-02-16 ENCOUNTER — Ambulatory Visit: Payer: BLUE CROSS/BLUE SHIELD | Admitting: Family Medicine

## 2017-02-21 ENCOUNTER — Telehealth: Payer: Self-pay | Admitting: Family Medicine

## 2017-02-21 NOTE — Telephone Encounter (Signed)
Called to confirm/remind patient about scheduled appointment and to advise to bring medications and to arrive early for check in. °

## 2017-02-21 NOTE — Telephone Encounter (Signed)
Has broken out in a rash.  It has been on back, arms, legs and foof. Sh is wondering if she could be having allergic to the flaygle.  She startedd showing signs after taken the pills for several days. Please advise

## 2017-02-21 NOTE — Telephone Encounter (Signed)
Return call to patient regarding possible medication reaction.  Patient stated she started to break out in a rash after starting the Flagyl.  She still have the rash and spreading.  Patient is not taking the medication at this time because she missed placed it. Pt advised not to take the medication again. She made an appointment with PCP tomorrow 02/22/17 at 3:15 PM.  Patient was able to come to clinic today due work schedule.  Allergies updated on patient's record.  Will forward to PCP.  Clovis PuMartin, Charee Tumblin L, RN

## 2017-02-22 ENCOUNTER — Ambulatory Visit: Payer: BLUE CROSS/BLUE SHIELD | Admitting: Family Medicine

## 2017-02-22 ENCOUNTER — Encounter: Payer: Self-pay | Admitting: Family Medicine

## 2017-02-22 ENCOUNTER — Ambulatory Visit (INDEPENDENT_AMBULATORY_CARE_PROVIDER_SITE_OTHER): Payer: BLUE CROSS/BLUE SHIELD | Admitting: Family Medicine

## 2017-02-22 DIAGNOSIS — R21 Rash and other nonspecific skin eruption: Secondary | ICD-10-CM

## 2017-02-22 MED ORDER — HYDROCORTISONE 2.5 % EX OINT: TOPICAL_OINTMENT | Freq: Two times a day (BID) | CUTANEOUS | 1 refills | Status: DC

## 2017-02-22 NOTE — Progress Notes (Signed)
RASH First noticed last week. First had it on back. Then spread to arms, legs, stomach, buttock. Had a URI on 3/10 >> lasted around 1 week; had subjective fever during that time.  Had rash for 7 days. Location: full body, not on palms or soles Medications tried: benadryl, helped w/ itching but nothing else Similar rash in past: no New medications or antibiotics: metronidazole  Tick, Insect or new pet exposure: no Recent travel: no New detergent or soap: no Immunocompromised: no  Symptoms Itching: yes Pain over rash: no; but sometimes has burning sensation Feeling ill all over: no Fever: no Mouth sores: no Face or tongue swelling: no Trouble breathing: no Joint swelling or pain: no  Review of Symptoms - see HPI PMH - Smoking status noted.    CC, SH/smoking status, and VS noted  Objective: BP (!) 140/100   Pulse 77   Temp 98.9 F (37.2 C) (Oral)   Ht 5\' 8"  (1.727 m)   Wt 228 lb (103.4 kg)   SpO2 99%   BMI 34.67 kg/m  Gen: NAD, alert, cooperative. CV: Well-perfused. Resp: Non-labored. Neuro: Sensation intact throughout. Integument: Numerous small papular/vesicular lesions noted across patient's arms back chest legs and buttock. No significant drainage present. No significant erythema present. Lesions are pruritic in nature. [See photo below of patients posterior upper left arm]    Assessment and plan:  Rash and nonspecific skin eruption Patient is here today for concern of her rash. Etiology currently unknown however was likely diagnosis is post viral exanthem vs. Papillary drug eruption. Approximately one week ago patient had a URI that had resolved. Patient was also started on Flagyl for BV, however this is been nearly completed by the time this rash presented making it less likely. - Hydrocortisone ointment provided. - Encouraged OTC Zyrtec/Benadryl for severe symptoms. - Reassurance - Discussed return precautions.   Meds ordered this encounter  Medications    . hydrocortisone 2.5 % ointment    Sig: Apply topically 2 (two) times daily.    Dispense:  30 g    Refill:  1     Kathee DeltonIan D McKeag, MD,MS,  PGY3 02/22/2017 6:33 PM

## 2017-02-22 NOTE — Assessment & Plan Note (Signed)
Patient is here today for concern of her rash. Etiology currently unknown however was likely diagnosis is post viral exanthem vs. Papillary drug eruption. Approximately one week ago patient had a URI that had resolved. Patient was also started on Flagyl for BV, however this is been nearly completed by the time this rash presented making it less likely. - Hydrocortisone ointment provided. - Encouraged OTC Zyrtec/Benadryl for severe symptoms. - Reassurance - Discussed return precautions.

## 2017-02-22 NOTE — Patient Instructions (Signed)
It was a pleasure seeing you today in our clinic. Today we discussed your rash. Here is the treatment plan we have discussed and agreed upon together:   - I have placed a order for hydrocortisone ointment. Apply this twice a day as needed to the areas of the skin being effected. - You can take Zyrtec or Benadryl to help with itching as well. - This rash should resolve in approximately the next 5 days. - If the rash gets significantly worse or you start to develop pain, fever, or blisters around the mouth or vagina do not hesitate to come back in for reevaluation.

## 2017-03-10 ENCOUNTER — Telehealth: Payer: Self-pay

## 2017-03-10 NOTE — Telephone Encounter (Signed)
Error

## 2017-03-14 ENCOUNTER — Telehealth: Payer: Self-pay | Admitting: Family Medicine

## 2017-03-14 NOTE — Telephone Encounter (Signed)
Appointment confirmed (03/15/17 10:30 am). Advised patient to come in early for check-in and to bring all current medications. No further concerns at this time.

## 2017-03-15 ENCOUNTER — Encounter: Payer: Self-pay | Admitting: Family Medicine

## 2017-03-15 ENCOUNTER — Ambulatory Visit (INDEPENDENT_AMBULATORY_CARE_PROVIDER_SITE_OTHER): Payer: BLUE CROSS/BLUE SHIELD | Admitting: Family Medicine

## 2017-03-15 DIAGNOSIS — R21 Rash and other nonspecific skin eruption: Secondary | ICD-10-CM

## 2017-03-15 MED ORDER — CETIRIZINE HCL 10 MG PO CAPS: 10.0000 mg | ORAL_CAPSULE | Freq: Every day | ORAL | 1 refills | Status: DC

## 2017-03-15 MED ORDER — SALICYLIC ACID 3 % EX SHAM: 1.0000 "application " | MEDICATED_SHAMPOO | CUTANEOUS | 0 refills | Status: AC

## 2017-03-15 NOTE — Assessment & Plan Note (Signed)
Patient here with persistent pruritus. Previously noted rash has since resolved. Patient has tried the prescribed hydrocortisone ointment but states that there was little to no improvement. Etiology still unknown at this time however patient endorsed new exposure to a pet rabbit. I still feel this may be a skin sensitivity.  - Zyrtec 10 mg nightly >> I encouraged patient to take this over the next week to assess if her symptoms improved. - Selsun Blue shampoo >> I have asked patient to hold off on using this for the next few days to allow any benefits from Zyrtec to take effect. If patient has no improvement in the next few days she is to start using this shampoo every other day for the next week. - Patient to follow-up in one week if symptoms persist or worsen.

## 2017-03-15 NOTE — Progress Notes (Signed)
   HPI  CC: Rash Patient is here for follow-up on her rash. She was seen on 02/22/17 for this same issue. She states that since that time she has had a resolution in the rash itself but she has had persistent pruritus. The itching she experiences seems to be involving majority of her body, with sparing of her soles and palms. She denies any new onset rash or skin changes. She denies any significant pain or burning. She states that the itching is greatest around her groin, arms, and scalp. Patient denies any new skin lesions. She has been using over-the-counter Vaseline, and states that the prescribed hydrocortisone cream has not helped.  Patient denies any fever, chills, headache, vision changes, chest pain, shortness of breath, nausea, vomiting, diarrhea, abdominal pain, weakness, numbness, or paresthesias. Patient denies any evidence of bugs in her bed or house. No other residents are experiencing this issue.  Patient does endorse a new exposure that she did not discuss at our last visit. She states that she has been staying at a cousin's house recently who has a rabbit.  Review of Systems See HPI for ROS.   CC, SH/smoking status, and VS noted  Objective: BP (!) 148/82   Pulse 77   Temp 99.4 F (37.4 C) (Oral)   Ht  (1.727 m)   Wt 228 lb 12.8 oz (103.8 kg)   SpO2 98%   BMI 34.79 kg/m  Gen: NAD, alert, cooperative, and pleasant. HEENT: NCAT, EOMI, PERRL CV: RRR, no murmur Resp: CTAB, non-labored Integument: Evidence of excoriations along her upper arms. No evidence of previously noted papular lesions on her arms and back. Very mild scalp dryness noted, no evidence of nits. Neuro: Alert and oriented, Speech clear, No gross deficits   Assessment and plan:  Rash and nonspecific skin eruption Patient here with persistent pruritus. Previously noted rash has since resolved. Patient has tried the prescribed hydrocortisone ointment but states that there was little to no improvement.  Etiology still unknown at this time however patient endorsed new exposure to a pet rabbit. I still feel this may be a skin sensitivity.  - Zyrtec 10 mg nightly >> I encouraged patient to take this over the next week to assess if her symptoms improved. - Selsun Blue shampoo >> I have asked patient to hold off on using this for the next few days to allow any benefits from Zyrtec to take effect. If patient has no improvement in the next few days she is to start using this shampoo every other day for the next week. - Patient to follow-up in one week if symptoms persist or worsen.   Meds ordered this encounter  Medications  . Cetirizine HCl 10 MG CAPS    Sig: Take 1 capsule (10 mg total) by mouth daily.    Dispense:  30 capsule    Refill:  1  . Salicylic Acid (SELSUN BLUE [employer]URALS) 3 % SHAM    Sig: Apply 1 application topically every other day.    Dispense:  133 mL    Refill:  0     Kathee Delton, MD,MS,  PGY3 03/15/2017 1:43 PM

## 2017-09-29 ENCOUNTER — Ambulatory Visit: Payer: BLUE CROSS/BLUE SHIELD | Admitting: Internal Medicine

## 2017-10-05 ENCOUNTER — Ambulatory Visit (INDEPENDENT_AMBULATORY_CARE_PROVIDER_SITE_OTHER): Payer: BLUE CROSS/BLUE SHIELD | Admitting: Internal Medicine

## 2017-10-05 ENCOUNTER — Telehealth: Payer: Self-pay | Admitting: Internal Medicine

## 2017-10-05 ENCOUNTER — Other Ambulatory Visit (HOSPITAL_COMMUNITY)
Admission: RE | Admit: 2017-10-05 | Discharge: 2017-10-05 | Disposition: A | Discharge status: Home / Self Care | Payer: BLUE CROSS/BLUE SHIELD | Source: Ambulatory Visit | Attending: Family Medicine | Admitting: Family Medicine

## 2017-10-05 ENCOUNTER — Encounter: Payer: Self-pay | Admitting: Internal Medicine

## 2017-10-05 ENCOUNTER — Telehealth: Payer: Self-pay | Admitting: *Deleted

## 2017-10-05 ENCOUNTER — Ambulatory Visit (HOSPITAL_COMMUNITY)
Admission: RE | Admit: 2017-10-05 | Discharge: 2017-10-05 | Disposition: A | Discharge status: Home / Self Care | Payer: BLUE CROSS/BLUE SHIELD | Source: Ambulatory Visit | Attending: Family Medicine | Admitting: Family Medicine

## 2017-10-05 VITALS — BP 118/82 | HR 76 | Temp 98.5°F | Ht 68.0 in | Wt 220.6 lb

## 2017-10-05 DIAGNOSIS — Z30432 Encounter for removal of intrauterine contraceptive device: Secondary | ICD-10-CM | POA: Insufficient documentation

## 2017-10-05 DIAGNOSIS — N898 Other specified noninflammatory disorders of vagina: Secondary | ICD-10-CM | POA: Diagnosis not present

## 2017-10-05 DIAGNOSIS — R102 Pelvic and perineal pain: Secondary | ICD-10-CM | POA: Insufficient documentation

## 2017-10-05 LAB — POCT WET PREP (WET MOUNT)
CLUE CELLS WET PREP WHIFF POC: NEGATIVE
TRICHOMONAS WET PREP HPF POC: ABSENT

## 2017-10-05 MED ORDER — METRONIDAZOLE 500 MG PO TABS: 500.0000 mg | ORAL_TABLET | Freq: Two times a day (BID) | ORAL | 0 refills | Status: DC

## 2017-10-05 NOTE — Telephone Encounter (Signed)
Patient left message on nurse line requesting results of U/S done today. [name]. Gust Eugene, RN, BSN

## 2017-10-05 NOTE — Patient Instructions (Signed)
We will call you with the results of your swabs and of your ultrasound.   Take Care,   Dr. Earlene PlaterWallace

## 2017-10-05 NOTE — Telephone Encounter (Signed)
Spoke to pt. Printing letter and leaving it up front for pt to pick up. Told pt I would forward this message to Dr. Earlene PlaterWallace and have her call her with the results. Sunday SpillersSharon T Anjel Pardo, CMA

## 2017-10-05 NOTE — Telephone Encounter (Signed)
Pt would like the results from her ultrasound.  She would also like a drs note for work. She has to go to work [employer]. Please advise

## 2017-10-05 NOTE — Telephone Encounter (Signed)
Called patient with ultrasound results. Will attempt another in office removal of IUD prior to referral to GYN. Have scheduled for this Friday, Nov 2.   Marcy Sirenatherine Brynleigh Sequeira, D.O. 10/05/2017, 4:56 PM PGY-3, [facility]

## 2017-10-05 NOTE — Progress Notes (Signed)
   Subjective:    [name] - 31 y.o. female MRN [id number]  Date of birth: 1986/07/20  HPI  [name] is here for contraception management. Requests IUD removal. IUD placed November 2015. Last PAP July 2017 negative for lesions and negative for HPV. Started to have discharge 4 days ago. Vaginal discharge is cloudy white in color. No associated foul odor or pruritis. Menstrual cycles have been irregular with IUD. LMP 08/01/17. Reports that she has had cramping left adnexal pain since the discharge began. This pain has been intermittent in nature. She has experienced occasional left sided cramping for several months to years but it is more constant and severe in intensity this time. No fevers or vomiting. Pain does not prevent her from day to day activities. She is not currently sexually active.    -  reports that she quit smoking about 5 years ago. Her smoking use included Cigarettes. She has never used smokeless tobacco. - Review of Systems: Per HPI. - Past Medical History: Patient Active Problem List   Diagnosis Date Noted  . Rash and nonspecific skin eruption 02/22/2017  . Rectal bleeding 07/05/2016  . Headache 03/21/2015  . Irregular intermenstrual bleeding 04/04/2014  . MOOD SWINGS 03/06/2009  . OVERWEIGHT 10/29/2008  . TOBACCO DEPENDENCE 02/02/2007   - Medications: reviewed and updated   Objective:   Physical Exam BP 118/82   Pulse 76   Temp 98.5 F (36.9 C) (Oral)   Ht 5\' 8"  (1.727 m)   Wt 220 lb 9.6 oz (100.1 kg)   SpO2 97%   BMI 33.54 kg/m  Gen: NAD, alert, cooperative with exam, well-appearing Abd: +BS, soft, NTND  GU/GYN: Exam performed in the presence of a chaperone. External genitalia within normal limits.  Vaginal mucosa pink, moist, normal rugae.  Nonfriable cervix without lesions or bleeding noted on speculum exam. Mild amount of white milky vaginal discharge present. Unable to visualize the IUD strings in the cervical os. Bimanual exam revealed normal,  nongravid uterus.  No cervical motion tenderness. No adnexal masses bilaterally. Left adnexal pain reproducible on exam.     Assessment & Plan:   1. Encounter for IUD removal Unable to visualize either IUD string on exam. Will obtain pelvic US for misplacement.  - US PELVIC COMPLETE WITH TRANSVAGINAL; Future  2. Vaginal discharge Wet prep consistent with mild BV. Will prescribe Flagyl. Patient reports that she does not believe allergy documented to Flagyl is true. Gave option of vaginal gel as opposed to pill and patient opted for PO version.  - POCT Wet Prep [facility]([place]) - Cervicovaginal ancillary only  3. Pelvic pain May be related to misplacement of IUD. Left ovarian cyst remains on differential given intermittent pain that she reports seems to be frequently related to menstrual cycles. Low suspicion for ovarian torsion as pain has been intermittent and recurrent and patient is not experiencing severe distress. Low suspicion for PID given lack of significant discharge on exam with normal appearing cervix, no CMT and stable vital signs. Low suspicion for intra-abdominal process such as appendicitis given benign abdominal exam. Will obtain pelvic ultrasound to further investigate.   [name], D.O. 10/05/2017, 8:47 AM PGY-3, [facility]

## 2017-10-06 LAB — CERVICOVAGINAL ANCILLARY ONLY
Chlamydia: NEGATIVE
Neisseria Gonorrhea: NEGATIVE

## 2017-10-07 ENCOUNTER — Encounter: Payer: Self-pay | Admitting: Internal Medicine

## 2017-10-07 ENCOUNTER — Ambulatory Visit (INDEPENDENT_AMBULATORY_CARE_PROVIDER_SITE_OTHER): Payer: BLUE CROSS/BLUE SHIELD | Admitting: Internal Medicine

## 2017-10-07 VITALS — BP 128/84 | HR 74 | Temp 98.2°F | Ht 68.0 in | Wt 219.0 lb

## 2017-10-07 DIAGNOSIS — Z538 Procedure and treatment not carried out for other reasons: Secondary | ICD-10-CM

## 2017-10-07 DIAGNOSIS — Z975 Presence of (intrauterine) contraceptive device: Secondary | ICD-10-CM

## 2017-10-07 MED ORDER — IBUPROFEN 200 MG PO TABS
800.0000 mg | ORAL_TABLET | Freq: Once | ORAL | Status: AC
  Administered 2017-10-07: 800 mg via ORAL

## 2017-10-07 NOTE — Progress Notes (Signed)
   Subjective:    [name] - 31 y.o. female MRN [id number]  Date of birth: 03-15-1986  HPI  [name] is here for IUD removal. Presented on 10/31 for IUD removal. Was unable to visualize strings at that time. Patient had active BV infection and vaginal discharge present. Was started on Flagyl. Ultrasound was obtained to evaluate for IUD placement and appropriate placement was confirmed.   -  reports that she quit smoking about 5 years ago. Her smoking use included Cigarettes. She has never used smokeless tobacco. - Review of Systems: Per HPI. - Past Medical History: Patient Active Problem List   Diagnosis Date Noted  . Rash and nonspecific skin eruption 02/22/2017  . Rectal bleeding 07/05/2016  . Headache 03/21/2015  . Irregular intermenstrual bleeding 04/04/2014  . MOOD SWINGS 03/06/2009  . OVERWEIGHT 10/29/2008  . TOBACCO DEPENDENCE 02/02/2007   - Medications: reviewed and updated   Objective:   Physical Exam BP 128/84   Pulse 74   Temp 98.2 F (36.8 C) (Oral)   Ht 5\' 8"  (1.727 m)   Wt 219 lb (99.3 kg)   SpO2 99%   BMI 33.30 kg/m  Gen: NAD, alert, cooperative with exam, well-appearing GU/GYN: Exam performed in the presence of a chaperone. External genitalia within normal limits.  Vaginal mucosa pink, moist, normal rugae.  Nonfriable cervix without lesions, no discharge or bleeding noted on speculum exam. Unable to visualize IUD strings from cervical os. Cytobrush used and was unable to hook strings.     Assessment & Plan:   1. Attempted IUD removal, unsuccessful Unable to visualize IUD strings on exam for removal. Correct placement has been confirmed by recent pelvic US. Will refer to Ob-GYN for removal with dilation of cervix. Ibuprofen given today due to cramping/discomfort from pelvic exam and attempted removal.  - Ambulatory referral to Obstetrics / Gynecology - ibuprofen (ADVIL,MOTRIN) tablet 800 mg; Take 4 tablets (800 mg total) by mouth once.  Erin Sirenatherine  Pennington, D.O. 10/07/2017, 12:10 PM PGY-3, [facility]

## 2017-10-07 NOTE — Patient Instructions (Signed)
Take 600 mg of Ibuprofen about 30 minutes prior to your appointment time for IUD removal to help with cramping and pain.   [facility], November [facility] at 1:45 pm.   Our referral coordinator will attempt to find something today once the open offices return from lunch.

## 2017-10-10 ENCOUNTER — Encounter: Payer: Self-pay | Admitting: Obstetrics & Gynecology

## 2017-10-10 ENCOUNTER — Ambulatory Visit (INDEPENDENT_AMBULATORY_CARE_PROVIDER_SITE_OTHER): Payer: BLUE CROSS/BLUE SHIELD | Admitting: Obstetrics & Gynecology

## 2017-10-10 VITALS — BP 130/90 | Ht 68.5 in | Wt 220.0 lb

## 2017-10-10 DIAGNOSIS — Z30432 Encounter for removal of intrauterine contraceptive device: Secondary | ICD-10-CM

## 2017-10-10 DIAGNOSIS — R102 Pelvic and perineal pain: Secondary | ICD-10-CM | POA: Diagnosis not present

## 2017-10-10 NOTE — Progress Notes (Signed)
    [name] July 18, 1986 [id number]        31 y.o.  G3P3L3  Single.  Currently abstinent.  RP:  Desires IUD removal because of left crampy pelvic pain   HPI:  Mirena IUD inserted in 2014.  Occasional light menses.   Experiencing intermittent left pelvic pain with cramps for several weeks.  2 removal attempts were unsuccessful with her Fam MD.  Last attempt was on 10/31st.  At that visit, a Pelvic US confirmed normal IU location of the IUD, Gono-Chlam were negative and BV pos.  She is currently on Flagyl to treat BV.  The Pelvic US showed a normal uterus and normal bilateral ovaries without cysts.  No UTI Sx.  BMs wnl.  No fever.  Past medical history,surgical history, problem list, medications, allergies, family history and social history were all reviewed and documented in the EPIC chart.  Directed ROS with pertinent positives and negatives documented in the history of present illness/assessment and plan.  Exam:  Vitals:   10/10/17 1408  BP: 130/90  Weight: 220 lb (99.8 kg)  Height: 5' 8.5" (1.74 m)   General appearance:  Normal  Abdomen:  Soft, NT, not distended.  Gyn exam:  Vulva normal.  Speculum:  Cervix/Vagina normal.  Hurricane spray on cervix, Betadine prep.  IUD strings very short, but visible in lower part of the Endocervical canal.  Strings easily grasped with a Boseman clamp and IUD removed complete and intact.  Shown to patient before discarding.  Bimanual exam:  Uterus normal volume, mobile, NT.  No adnexal mass felt, NT.   Assessment/Plan:  31 y.o. G3P3003   1. Pelvic pain in female Left crampy pelvic pain.  2 previous unsuccessful attempt at removal of IUD.  Per Pelvic US, IUD was in normal IU location.  Patient desires removal.  2. Encounter for removal of intrauterine contraceptive device (IUD) Successful removal of Mirena IUD.  Contraception discussed.  Declines hormonal contraception at this time.  Abstinent currently.  If sexually active, will use strict  condom with Spermicide.    F/U Annual/Gyn exam 7 or 07/2018.  Counseling on above issues >50% x 30 minutes.  Genia DelMarie-Lyne Kiondre Grenz MD, 2:21 PM 10/10/2017

## 2017-10-10 NOTE — Patient Instructions (Signed)
1. Pelvic pain in female Left crampy pelvic pain.  2 previous unsuccessful attempt at removal of IUD.  Per Pelvic US, IUD was in normal IU location.  Patient desires removal.  2. Encounter for removal of intrauterine contraceptive device (IUD) Successful removal of Mirena IUD.  Contraception discussed.  Declines hormonal contraception at this time.  Abstinent currently.  If sexually active, will use strict condom with Spermicide.    F/U Annual/Gyn exam 7 or 07/2018.  [name], it was a pleasure meeting you today!  I will see you again for your Annual/Gyn visit in July to August 2019.

## 2018-07-05 ENCOUNTER — Encounter: Payer: BLUE CROSS/BLUE SHIELD | Admitting: Obstetrics & Gynecology

## 2019-09-11 ENCOUNTER — Encounter: Payer: Self-pay | Admitting: Obstetrics & Gynecology

## 2019-09-11 ENCOUNTER — Other Ambulatory Visit: Payer: Self-pay

## 2019-09-11 ENCOUNTER — Ambulatory Visit (INDEPENDENT_AMBULATORY_CARE_PROVIDER_SITE_OTHER): Payer: Self-pay | Admitting: Obstetrics & Gynecology

## 2019-09-11 VITALS — BP 150/90 | Ht 68.0 in | Wt 238.0 lb

## 2019-09-11 DIAGNOSIS — A630 Anogenital (venereal) warts: Secondary | ICD-10-CM

## 2019-09-11 DIAGNOSIS — Z113 Encounter for screening for infections with a predominantly sexual mode of transmission: Secondary | ICD-10-CM

## 2019-09-11 DIAGNOSIS — F17210 Nicotine dependence, cigarettes, uncomplicated: Secondary | ICD-10-CM

## 2019-09-11 DIAGNOSIS — Z01419 Encounter for gynecological examination (general) (routine) without abnormal findings: Secondary | ICD-10-CM

## 2019-09-11 DIAGNOSIS — K629 Disease of anus and rectum, unspecified: Secondary | ICD-10-CM

## 2019-09-11 DIAGNOSIS — E6609 Other obesity due to excess calories: Secondary | ICD-10-CM

## 2019-09-11 DIAGNOSIS — N898 Other specified noninflammatory disorders of vagina: Secondary | ICD-10-CM

## 2019-09-11 DIAGNOSIS — Z789 Other specified health status: Secondary | ICD-10-CM

## 2019-09-11 DIAGNOSIS — Z1151 Encounter for screening for human papillomavirus (HPV): Secondary | ICD-10-CM

## 2019-09-11 DIAGNOSIS — Z6836 Body mass index (BMI) 36.0-36.9, adult: Secondary | ICD-10-CM

## 2019-09-11 DIAGNOSIS — E66812 Obesity, class 2: Secondary | ICD-10-CM

## 2019-09-11 MED ORDER — TINIDAZOLE 500 MG PO TABS: 2.0000 g | ORAL_TABLET | Freq: Two times a day (BID) | ORAL | 0 refills | Status: AC

## 2019-09-11 NOTE — Progress Notes (Signed)
[name] 03/08/1986 [id number]   History:    33 y.o. G3P3L3 Single  RP:  Established patient presenting for annual gyn exam   HPI: Menses every 1-2 months with normal flow.  No BTB.  No pelvic pain.  C/O a vaginal discharge, sometimes with itching, sometimes with odor.  Noticed 3 wart like lesions at the posterior vulva.  Anal red lesion.  Last sexually active, new partner, in 06/2019.  No condom use.  Would like STI screen.  Had a normal menstrual period after on 07/10/2019.  Urine/BMs normal.  Breasts normal.  BMI 36.19.  Cigarette smoker.  Will do health labs here.  Past medical history,surgical history, family history and social history were all reviewed and documented in the EPIC chart.  Gynecologic History Patient's last menstrual period was 07/10/2019. Contraception: condoms Last Pap: 06/2016. Results were: Negative/HPV HR negative Last mammogram: Never Bone Density: Never Colonoscopy: Never  Obstetric History OB History  Gravida Para Term Preterm AB Living  3 3 3  0 0 3  SAB TAB Ectopic Multiple Live Births  0 0 0 0 3    # Outcome Date GA Lbr Len/2nd Weight Sex Delivery Anes PTL Lv  3 Term 05/14/12 [id number]:07 / 00:03 8 lb 6 oz (3.799 kg) M Vag-Spont None  LIV  2 Term 09/28/09 371w5d8 lb 4 oz (3.742 kg) F Vag-Spont None  LIV  1 Term 12/30/04 3944w0d lb 5 oz (3.771 kg) M Vag-Spont EPI  LIV     ROS: A ROS was performed and pertinent positives and negatives are included in the history.  GENERAL: No fevers or chills. HEENT: No change in vision, no earache, sore throat or sinus congestion. NECK: No pain or stiffness. CARDIOVASCULAR: No chest pain or pressure. No palpitations. PULMONARY: No shortness of breath, cough or wheeze. GASTROINTESTINAL: No abdominal pain, nausea, vomiting or diarrhea, melena or bright red blood per rectum. GENITOURINARY: No urinary frequency, urgency, hesitancy or dysuria. MUSCULOSKELETAL: No joint or muscle pain, no back pain, no recent trauma.  DERMATOLOGIC: No rash, no itching, no lesions. ENDOCRINE: No polyuria, polydipsia, no heat or cold intolerance. No recent change in weight. HEMATOLOGICAL: No anemia or easy bruising or bleeding. NEUROLOGIC: No headache, seizures, numbness, tingling or weakness. PSYCHIATRIC: No depression, no loss of interest in normal activity or change in sleep pattern.     Exam:   BP (!) 150/90   Ht 5' 8"  (1.727 m)   Wt 238 lb (108 kg)   LMP 07/10/2019   BMI 36.19 kg/m   Body mass index is 36.19 kg/m.  General appearance : Well developed well nourished female. No acute distress HEENT: Eyes: no retinal hemorrhage or exudates,  Neck supple, trachea midline, no carotid bruits, no thyroidmegaly Lungs: Clear to auscultation, no rhonchi or wheezes, or rib retractions  Heart: Regular rate and rhythm, no murmurs or gallops Breast:Examined in sitting and supine position were symmetrical in appearance, no palpable masses or tenderness,  no skin retraction, no nipple inversion, no nipple discharge, no skin discoloration, no axillary or supraclavicular lymphadenopathy Abdomen: no palpable masses or tenderness, no rebound or guarding Extremities: no edema or skin discoloration or tenderness  Pelvic: Vulva: 3 raised warty lesions at posterior vulva/fourchette             Vagina: No gross lesions or discharge  Cervix: No gross lesions or discharge.  Pap/HPV HR, Gono-Chlam done  Uterus  AV, normal size, shape and consistency, non-tender and mobile  Adnexa  Without masses or tenderness  Anus: Protruding inflamed lesion. Polyp? Wart?   Assessment/Plan:  33 y.o. female for annual exam   1. Encounter for routine gynecological examination with Papanicolaou smear of cervix Gynecologic exam with vulvar condylomas and a probable anal polyp versus wart.  Pap test with high-risk HPV done.  Breast exam normal.  Health labs here today. - CBC - TSH - Comp Met (CMET); Future - Lipid panel; Future  2. Use of condoms for  contraception  3. Vaginal discharge Bacterial Vaginosis confirmed by wet prep.  Will treat with tinidazole 2 tablets twice a day for 2 days.  No contraindication.  Usage reviewed and prescription sent to pharmacy.  4. Screen for STD (sexually transmitted disease) Strict condom use recommended. - Gono-Chlam on Pap - HIV antibody (with reflex) - RPR - Hepatitis C Antibody - Hepatitis B Surface AntiGEN  5. Vulvar warts Counseling done on vulvar condylomas.  F/U Vulvar Colposcopy with excision of vulvar lesions.  6. Anal lesion Probable anal polyp, possible rectal and anal condyloma.  Refer to general surgeon.  7. Cigarette smoker Strongly recommended to quit.  8. Class 2 obesity due to excess calories without serious comorbidity with body mass index (BMI) of 36.0 to 36.9 in adult Lower calorie/carb diet such as [employer] recommended.  Aerobic physical activities 5 times a week and weightlifting every 2 days.  Other orders - tinidazole (TINDAMAX) 500 MG tablet; Take 4 tablets (2,000 mg total) by mouth 2 (two) times daily for 2 days.  Counseling on above issues and coordination of care >50% for 10 minutes.  Princess Bruins MD, 4:10 PM 09/11/2019

## 2019-09-12 LAB — HEPATITIS B SURFACE ANTIGEN: Hepatitis B Surface Ag: NONREACTIVE

## 2019-09-12 LAB — CBC
HCT: 35.3 % (ref 35.0–45.0)
Hemoglobin: 11.3 g/dL — ABNORMAL LOW (ref 11.7–15.5)
MCH: 26.4 pg — ABNORMAL LOW (ref 27.0–33.0)
MCHC: 32 g/dL (ref 32.0–36.0)
MCV: 82.5 fL (ref 80.0–100.0)
MPV: 11.3 fL (ref 7.5–12.5)
Platelets: 242 10*3/uL (ref 140–400)
RBC: 4.28 10*6/uL (ref 3.80–5.10)
RDW: 12.6 % (ref 11.0–15.0)
WBC: 6.9 10*3/uL (ref 3.8–10.8)

## 2019-09-12 LAB — TSH: TSH: 0.84 mIU/L

## 2019-09-12 LAB — HEPATITIS C ANTIBODY
Hepatitis C Ab: NONREACTIVE
SIGNAL TO CUT-OFF: 0.02 (ref <1.00)

## 2019-09-12 LAB — RPR: RPR Ser Ql: NONREACTIVE

## 2019-09-12 LAB — HIV ANTIBODY (ROUTINE TESTING W REFLEX): HIV 1&2 Ab, 4th Generation: NONREACTIVE

## 2019-09-13 ENCOUNTER — Telehealth: Payer: Self-pay | Admitting: *Deleted

## 2019-09-13 LAB — WET PREP FOR TRICH, YEAST, CLUE

## 2019-09-13 NOTE — Telephone Encounter (Signed)
Referral placed at [facility] they will call to schedule. 

## 2019-09-13 NOTE — Telephone Encounter (Signed)
-----   Message from Princess Bruins, MD sent at 09/11/2019  4:55 PM EDT ----- Regarding: Refer to general surgery Anal inflamed polyp and/or warts.  (Vulvar warts present)

## 2019-09-14 ENCOUNTER — Encounter: Payer: Self-pay | Admitting: Obstetrics & Gynecology

## 2019-09-14 ENCOUNTER — Encounter: Payer: Self-pay | Admitting: *Deleted

## 2019-09-14 LAB — PAP IG, CT-NG NAA, HPV HIGH-RISK
C. trachomatis RNA, TMA: NOT DETECTED
HPV DNA High Risk: NOT DETECTED
N. gonorrhoeae RNA, TMA: NOT DETECTED

## 2019-09-14 NOTE — Progress Notes (Signed)
Pt walked into office after calling and leaving a VM for an apt. complaining of headache and high blood pressure.  Her work advised her to leave.  With her blood pressure elevated and migraine, Front desk staff Abigail Butts advised her to go to Urgent care for follow up. Pt agreed [facility] CMA

## 2019-09-14 NOTE — Patient Instructions (Signed)
1. Encounter for routine gynecological examination with Papanicolaou smear of cervix Gynecologic exam with vulvar condylomas and a probable anal polyp versus wart.  Pap test with high-risk HPV done.  Breast exam normal.  Health labs here today. - CBC - TSH - Comp Met (CMET); Future - Lipid panel; Future  2. Use of condoms for contraception  3. Vaginal discharge Bacterial Vaginosis confirmed by wet prep.  Will treat with tinidazole 2 tablets twice a day for 2 days.  No contraindication.  Usage reviewed and prescription sent to pharmacy.  4. Screen for STD (sexually transmitted disease) Strict condom use recommended. - Gono-Chlam on Pap - HIV antibody (with reflex) - RPR - Hepatitis C Antibody - Hepatitis B Surface AntiGEN  5. Vulvar warts Counseling done on vulvar condylomas.  F/U Vulvar Colposcopy with excision of vulvar lesions.  6. Anal lesion Probable anal polyp, possible rectal and anal condyloma.  Refer to general surgeon.  7. Cigarette smoker Strongly recommended to quit.  8. Class 2 obesity due to excess calories without serious comorbidity with body mass index (BMI) of 36.0 to 36.9 in adult Lower calorie/carb diet such as [place] diet recommended.  Aerobic physical activities 5 times a week and weightlifting every 2 days.  Other orders - tinidazole (TINDAMAX) 500 MG tablet; Take 4 tablets (2,000 mg total) by mouth 2 (two) times daily for 2 days.  Caroly, it was a pleasure seeing you today!  I will inform you of your results as soon as they are available. 

## 2019-09-19 NOTE — Telephone Encounter (Signed)
Patient scheduled with Dr. Leighton Ruff on 16/5/79 11:20am

## 2019-11-23 ENCOUNTER — Other Ambulatory Visit: Payer: Self-pay

## 2019-11-26 ENCOUNTER — Encounter: Payer: Self-pay | Admitting: Women's Health

## 2019-11-26 ENCOUNTER — Ambulatory Visit: Payer: BC Managed Care – PPO | Admitting: Women's Health

## 2019-11-26 ENCOUNTER — Other Ambulatory Visit: Payer: Self-pay

## 2019-11-26 VITALS — BP 128/84

## 2019-11-26 DIAGNOSIS — N898 Other specified noninflammatory disorders of vagina: Secondary | ICD-10-CM | POA: Diagnosis not present

## 2019-11-26 DIAGNOSIS — Z23 Encounter for immunization: Secondary | ICD-10-CM

## 2019-11-26 LAB — WET PREP FOR TRICH, YEAST, CLUE

## 2019-11-26 MED ORDER — IMIQUIMOD 5 % EX CREA: TOPICAL_CREAM | CUTANEOUS | 1 refills | Status: DC

## 2019-11-26 NOTE — Patient Instructions (Signed)
Genital Warts Genital warts are a common STD (sexually transmitted disease). They may appear as small bumps on the skin of the genital and anal areas. They sometimes become irritated and cause pain. Genital warts are easily passed to other people through sexual contact. Many people do not know that they are infected. They may be infected for years without symptoms. However, even if they do not have symptoms, they can pass the infection to their sexual partners. Getting treatment is important because genital warts can lead to other problems. In females, the virus that causes genital warts may increase the risk for cervical cancer. What are the causes? This condition is caused by a virus that is called human papillomavirus (HPV). HPV is spread by having unprotected sex with an infected person. It can be spread through vaginal, anal, and oral sex. What increases the risk? You are more likely to develop this condition if:  You have unprotected sex.  You have multiple sexual partners.  You are sexually active before age 16.  You are a man who isnot circumcised.  You have a female sexual partner who is not circumcised.  You have a weakened body defense system (immune system) due to disease or medicine.  You smoke. What are the signs or symptoms? Symptoms of this condition include:  Small growths in the genital area or anal area. These warts often grow in clusters.  Itching and irritation in the genital area or anal area.  Bleeding from the warts.  Pain during sex. How is this diagnosed? This condition is diagnosed based on:  Your symptoms.  A physical exam. You may also have other tests, including:  Biopsy. A tissue sample is removed so it can be checked under a microscope.  Colposcopy. In females, a magnifying tool is used to examine the vagina and cervix. Certain solutions may be used to make the HPV cells change color so they can be seen more easily.  A Pap test in females.   Tests for other STDs. How is this treated? This condition may be treated with:  Medicines, such as: ? Solutions or creams applied to your skin (topical).  Procedures, such as: ? Freezing the warts with liquid nitrogen (cryotherapy). ? Burning the warts with a laser or electric probe (electrocautery). ? Surgery to remove the warts. Follow these instructions at home: Medicines   Apply over-the-counter and prescription medicines only as told by your health care provider.  Do not treat genital warts with medicines that are used for treating hand warts.  Talk with your health care provider about using over-the-counter anti-itch creams. Instructions for women  Get screened regularly for cervical cancer. Women who have genital warts are at an increased risk for this cancer. This type of cancer is slow growing and can be cured if it is found early.  If you become pregnant, tell your health care provider that you have had an HPV infection. Your health care provider will monitor you closely during pregnancy to be sure that your baby is safe. General instructions  Do not touch or scratch the warts.  Do not have sex until your treatment has been completed.  Tell your current and past sexual partners about your condition because they may also need treatment.  After treatment, use condoms during sex to prevent future infections.  Keep all follow-up visits as told by your health care provider. This is important. How is this prevented? Talk with your health care provider about getting the HPV vaccine. The vaccine:    Can prevent some HPV infections and cancers.  Is recommended for males and females who are 11-26 years old.  Is not recommended for pregnant women.  Will not work if you already have HPV. Contact a health care provider if you:  Have redness, swelling, or pain in the area of the treated skin.  Have a fever.  Feel generally ill.  Feel lumps in and around your genital or  anal area.  Have bleeding in your genital or anal area.  Have pain during sex. Summary  Genital warts are a common STD (sexually transmitted disease). It may appear as small bumps on the genital and anal areas.  This condition is caused by a virus that is called human papillomavirus (HPV). HPV is spread by having unprotected sex with an infected person. It can be spread through vaginal, anal, and oral sex.  Treatment is important because genital warts can lead to other problems. In females, the virus that causes genital warts may increase the risk for cervical cancer.  This condition may be treated with medicine that is applied to the skin, or procedures to remove the warts.  The HPV vaccine can prevent some HPV infections and cancers. It is recommended that the vaccine be given to males and females who are 11-26 years old. This information is not intended to replace advice given to you by your health care provider. Make sure you discuss any questions you have with your health care provider. Document Released: 11/19/2000 Document Revised: 12/27/2017 Document Reviewed: 12/27/2017 Elsevier Patient Education  2020 Elsevier Inc.  

## 2019-11-26 NOTE — Progress Notes (Signed)
33 year old SBF G3, P3 presents with complaint of vaginal warts for the past few months and requesting treatment.  09/2019 - STD screen, not sexually active since.  Did not receive Gardasil.  Monthly cycle using condoms.  Had problems with irregular bleeding with Nexplanon and questionable problem with IUD insertion.  Options reviewed, has had some elevated blood pressures in the past.  Would like to proceed with Mirena IUD.  Denies urinary symptoms, abdominal/back pain or fever.  States having increased discharge with occasional itching.  Reports quit smoking.  Exam: Appears well.  External genitalia 3 genital wart lesions at posterior fourchette, area prepped with Xylocaine jelly, TCA 80% applied, tolerated well.  Wet prep done with a Q-tip negative.  Genital warts at osterior fourchette   Plan: Aldara cream apply small amount Monday, Wednesday, Friday wash off in a.m. to affected area.  Return to office if continued problems.  Contraception options reviewed we will check Mirena IUD coverage and have Dr. Dellis Filbert place with menses.  Reviewed slight risk of infection, perforation and hemorrhage.  May have some irregular cycles initially and may become amenorrheic.  Reviewed importance of condoms until permanent partner.  Gardasil information given did not receive first given instructed to return to office in 2 and 6 months to complete the series.

## 2019-11-28 ENCOUNTER — Ambulatory Visit: Payer: Self-pay | Attending: Internal Medicine

## 2019-12-16 DIAGNOSIS — Z20822 Contact with and (suspected) exposure to covid-19: Secondary | ICD-10-CM | POA: Diagnosis not present

## 2020-01-28 ENCOUNTER — Other Ambulatory Visit: Payer: Self-pay

## 2020-01-28 ENCOUNTER — Ambulatory Visit (INDEPENDENT_AMBULATORY_CARE_PROVIDER_SITE_OTHER): Payer: BC Managed Care – PPO | Admitting: *Deleted

## 2020-01-28 DIAGNOSIS — Z23 Encounter for immunization: Secondary | ICD-10-CM | POA: Diagnosis not present

## 2020-01-29 ENCOUNTER — Ambulatory Visit: Payer: Self-pay | Admitting: General Surgery

## 2020-01-29 DIAGNOSIS — K642 Third degree hemorrhoids: Secondary | ICD-10-CM | POA: Diagnosis not present

## 2020-01-29 NOTE — H&P (Signed)
  History of Present Illness Romie Levee MD; 01/29/2020 10:40 AM) The patient is a 34 year old female who presents with a complaint of anal problems. 34 year old female who presents to the office with intermittent anal pain for the past year. She describes a prolapsing nodule that comes out after bowel movements. She can manually reduce this. She has had trouble with bleeding in the past, but has not had any bleeding recently. She reports mostly regular bowel habits with occasional constipation. She has never had a colonoscopy.   Problem List/Past Medical Romie Levee, MD; 01/29/2020 12:23 PM) PROLAPSED INTERNAL HEMORRHOIDS, GRADE 3 (Q68.3)  Past Surgical History Romie Levee, MD; 01/29/2020 12:23 PM) No pertinent past surgical history  Diagnostic Studies History Romie Levee, MD; 01/29/2020 12:23 PM) Colonoscopy never Mammogram within last year  Allergies (Tanisha A. Manson Passey, RMA; 01/29/2020 10:33 AM) No Known Drug Allergies [01/29/2020]: Allergies Reconciled  Medication History (Tanisha A. Manson Passey, RMA; 01/29/2020 10:33 AM) No Current Medications Medications Reconciled  Social History Romie Levee, MD; 01/29/2020 12:23 PM) Alcohol use Recently quit alcohol use. Caffeine use Carbonated beverages, Coffee, Tea. No drug use Tobacco use Former smoker.  Family History Romie Levee, MD; 01/29/2020 12:23 PM) Family history unknown First Degree Relatives  Pregnancy / Birth History Romie Levee, MD; 01/29/2020 12:23 PM) Age at menarche 10 years. Contraceptive History Contraceptive implant, Depo-provera, Intrauterine device, Oral contraceptives. Gravida 4 Length (months) of breastfeeding 12-24 Maternal age 25-25 Para 3 Regular periods  Other Problems Romie Levee, MD; 01/29/2020 12:23 PM) Back Pain     Review of Systems Romie Levee MD; 01/29/2020 12:23 PM) Gastrointestinal Present- Rectal Pain. All other systems negative  Vitals (Tanisha A.  Brown RMA; 01/29/2020 10:34 AM) 01/29/2020 10:33 AM Weight: 231 lb Height: 68in Body Surface Area: 2.17 m Body Mass Index: 35.12 kg/m  Temp.: 70F  Pulse: 82 (Regular)  BP: 132/84 (Sitting, Left Arm, Standard)        Physical Exam Romie Levee MD; 01/29/2020 12:20 PM)  General Mental Status-Alert. General Appearance-Cooperative.  Abdomen Palpation/Percussion Palpation and Percussion of the abdomen reveal - Soft and Non Tender.  Rectal Anorectal Exam External - skin tag. Note: prolapsed hemorrhoid. Internal - increased sphincter tone.   Results Romie Levee MD; 01/29/2020 12:22 PM) Procedures  Name Value Date ANOSCOPY, DIAGNOSTIC (41962) [ Hemorrhoids ] Procedure Other: Procedure: Anoscopy....Marland KitchenMarland KitchenSurgeon: Maisie Fus....Marland KitchenMarland KitchenAfter the risks and benefits were explained, verbal consent was obtained for above procedure. A medical assistant chaperone was present thoroughout the entire procedure. ....Marland KitchenMarland KitchenAnesthesia: none....Marland KitchenMarland KitchenDiagnosis: anal pain....Marland KitchenMarland KitchenFindings: prolapsed R posterior hemorrhoid, grade 1 RP and LL  Performed: 01/29/2020 12:21 PM    Assessment & Plan Romie Levee MD; 01/29/2020 10:50 AM)  PROLAPSED INTERNAL HEMORRHOIDS, GRADE 3 (I29.7) Impression: 34 year old female who presents to the office with a right posterior grade 3 hemorrhoids. She has minimal external disease. I have recommended a resection and hemorrhoidal pexy. We have discussed this in detail including typical postoperative pain and return to work status. I recommended that she forego heavy lifting for approximate 48 hours after surgery. After that she can get back to her activities as tolerated. I do not see any other hemorrhoid disease and hopefully this will keep her symptoms managed for a long time to come. We did discuss the risk of recurrence with continued constipation in the future.

## 2020-02-05 ENCOUNTER — Other Ambulatory Visit: Payer: Self-pay

## 2020-02-05 ENCOUNTER — Ambulatory Visit (INDEPENDENT_AMBULATORY_CARE_PROVIDER_SITE_OTHER): Payer: BC Managed Care – PPO | Admitting: Obstetrics & Gynecology

## 2020-02-05 ENCOUNTER — Ambulatory Visit: Payer: BC Managed Care – PPO | Admitting: Obstetrics & Gynecology

## 2020-02-05 ENCOUNTER — Encounter: Payer: Self-pay | Admitting: Obstetrics & Gynecology

## 2020-02-05 VITALS — BP 126/84

## 2020-02-05 DIAGNOSIS — Z30430 Encounter for insertion of intrauterine contraceptive device: Secondary | ICD-10-CM | POA: Diagnosis not present

## 2020-02-05 NOTE — Patient Instructions (Signed)
1. Encounter for IUD insertion Last menstrual period normal on January 31, 2020.  Desires contraception with the Mirena IUD.  Mirena IUD inserted without difficulty and no complication.  Well-tolerated by patient.  Postprocedure precautions reviewed.  We will follow-up in 4 weeks for IUD check.  [name], it was a pleasure seeing you today!

## 2020-02-05 NOTE — Progress Notes (Signed)
    [name] Apr 08, 1986 [id number]        34 y.o.  G3P3003   RP: Mirena IUD insertion  HPI: Normal menstrual period 01/31/2020.  No pelvic pain.  Normal vaginal secretions.     OB History  Gravida Para Term Preterm AB Living  3 3 3  0 0 3  SAB TAB Ectopic Multiple Live Births  0 0 0 0 3    # Outcome Date GA Lbr Len/2nd Weight Sex Delivery Anes PTL Lv  3 Term 05/14/12 [redacted]w[redacted]d 34:07 / 00:03 8 lb 6 oz (3.799 kg) M Vag-Spont None  LIV  2 Term 09/28/09 [redacted]w[redacted]d  8 lb 4 oz (3.742 kg) F Vag-Spont None  LIV  1 Term 12/30/04 [redacted]w[redacted]d  8 lb 5 oz (3.771 kg) M Vag-Spont EPI  LIV    Past medical history,surgical history, problem list, medications, allergies, family history and social history were all reviewed and documented in the EPIC chart.   Directed ROS with pertinent positives and negatives documented in the history of present illness/assessment and plan.  Exam:  Vitals:   02/05/20 1156  BP: 126/84   General appearance:  Normal                                                                    IUD procedure note       Patient presented to the office today for placement of Mirena IUD. The patient had previously been provided with literature information on this method of contraception. The risks benefits and pros and cons were discussed and all her questions were answered. She is fully aware that this form of contraception is 99% effective and is good for 5 years.  Pelvic exam: Vulva normal Vagina: No lesions or discharge Cervix: No lesions or discharge Uterus: AV position Adnexa: No masses or tenderness Rectal exam: Not done  The cervix was cleansed with Betadine solution. Hurricane spray on the cervix.  A single-tooth tenaculum was placed on the anterior cervical lip. The IUD was shown to the patient and inserted in a sterile fashion.  Hysterometry with the IUD as being inserted was 7 cm.  The IUD string was trimmed. The single-tooth tenaculum was removed. Patient was instructed to  return back to the office in one month for follow up.        Assessment/Plan:  34 y.o. G3P3003   1. Encounter for IUD insertion Last menstrual period normal on January 31, 2020.  Desires contraception with the Mirena IUD.  Mirena IUD inserted without difficulty and no complication.  Well-tolerated by patient.  Postprocedure precautions reviewed.  We will follow-up in 4 weeks for IUD check.   February 02, 2020 MD, 12:10 PM 02/05/2020

## 2020-02-11 ENCOUNTER — Encounter: Payer: Self-pay | Admitting: Anesthesiology

## 2020-03-03 ENCOUNTER — Other Ambulatory Visit: Payer: Self-pay

## 2020-03-04 ENCOUNTER — Ambulatory Visit: Payer: BC Managed Care – PPO | Admitting: Obstetrics & Gynecology

## 2020-04-10 ENCOUNTER — Other Ambulatory Visit: Payer: Self-pay

## 2020-04-11 ENCOUNTER — Encounter: Payer: Self-pay | Admitting: Obstetrics & Gynecology

## 2020-04-11 ENCOUNTER — Ambulatory Visit: Payer: BC Managed Care – PPO | Admitting: Obstetrics & Gynecology

## 2020-04-11 VITALS — BP 140/86

## 2020-04-11 DIAGNOSIS — N921 Excessive and frequent menstruation with irregular cycle: Secondary | ICD-10-CM

## 2020-04-11 DIAGNOSIS — Z975 Presence of (intrauterine) contraceptive device: Secondary | ICD-10-CM

## 2020-04-11 NOTE — Progress Notes (Signed)
    [name] 08-Apr-1986 [id number]        34 y.o.  G3P3003   RP: Mirena IUD check, BTB  HPI: Mirena IUD insertion 02/05/2020.  Had frequent BTB since insertion with no pelvic pain.  Stopped bleeding x yesterday.  Normal vaginal secretions.  No pain with IC.  No fever.   OB History  Gravida Para Term Preterm AB Living  3 3 3  0 0 3  SAB TAB Ectopic Multiple Live Births  0 0 0 0 3    # Outcome Date GA Lbr Len/2nd Weight Sex Delivery Anes PTL Lv  3 Term 05/14/12 [redacted]w[redacted]d 34:07 / 00:03 8 lb 6 oz (3.799 kg) M Vag-Spont None  LIV  2 Term 09/28/09 [redacted]w[redacted]d  8 lb 4 oz (3.742 kg) F Vag-Spont None  LIV  1 Term 12/30/04 [redacted]w[redacted]d  8 lb 5 oz (3.771 kg) M Vag-Spont EPI  LIV    Past medical history,surgical history, problem list, medications, allergies, family history and social history were all reviewed and documented in the EPIC chart.   Directed ROS with pertinent positives and negatives documented in the history of present illness/assessment and plan.  Exam:  Vitals:   04/11/20 0836  BP: 140/86   General appearance:  Normal  Abdomen: Normal  Gynecologic exam: Vulva normal.  Speculum:  Cervix normal.  IUD strings visible at EO.  No bleeding.  Normal secretions.   Assessment/Plan:  34 y.o. G3P3003   1. Breakthrough bleeding with IUD Recent insertion of Mirena IUD February 05, 2020 without complication.  IUD in good position with no sign of infection.  Breakthrough bleeding associated with recent insertion of Mirena IUD, no current bleeding.  Patient offered to control on a low dosage birth control pill for 1 pack.  Prefers observation.  Precautions reviewed.  Follow-up at annual gynecologic exam.  February 07, 2020 MD, 8:42 AM 04/11/2020

## 2020-04-11 NOTE — Patient Instructions (Signed)
1. Breakthrough bleeding with IUD Recent insertion of Mirena IUD February 05, 2020 without complication.  IUD in good position with no sign of infection.  Breakthrough bleeding associated with recent insertion of Mirena IUD, no current bleeding.  Patient offered to control on a low dosage birth control pill for 1 pack.  Prefers observation.  Precautions reviewed.  Follow-up at annual gynecologic exam.  [name], it was a pleasure seeing you today!

## 2020-05-15 DIAGNOSIS — Z23 Encounter for immunization: Secondary | ICD-10-CM | POA: Diagnosis not present

## 2020-05-15 DIAGNOSIS — S97111A Crushing injury of right great toe, initial encounter: Secondary | ICD-10-CM | POA: Diagnosis not present

## 2020-05-27 ENCOUNTER — Other Ambulatory Visit: Payer: Self-pay

## 2020-05-27 ENCOUNTER — Ambulatory Visit (INDEPENDENT_AMBULATORY_CARE_PROVIDER_SITE_OTHER): Payer: BC Managed Care – PPO | Admitting: *Deleted

## 2020-05-27 DIAGNOSIS — Z23 Encounter for immunization: Secondary | ICD-10-CM | POA: Diagnosis not present

## 2024-01-03 ENCOUNTER — Other Ambulatory Visit: Payer: Self-pay

## 2024-01-03 ENCOUNTER — Emergency Department (HOSPITAL_BASED_OUTPATIENT_CLINIC_OR_DEPARTMENT_OTHER): Payer: 59

## 2024-01-03 ENCOUNTER — Emergency Department (HOSPITAL_BASED_OUTPATIENT_CLINIC_OR_DEPARTMENT_OTHER)
Admission: EM | Admit: 2024-01-03 | Discharge: 2024-01-03 | Disposition: A | Discharge status: Home / Self Care | Payer: 59 | Attending: Emergency Medicine | Admitting: Emergency Medicine

## 2024-01-03 ENCOUNTER — Encounter (HOSPITAL_BASED_OUTPATIENT_CLINIC_OR_DEPARTMENT_OTHER): Payer: Self-pay | Admitting: Emergency Medicine

## 2024-01-03 DIAGNOSIS — Z79899 Other long term (current) drug therapy: Secondary | ICD-10-CM | POA: Diagnosis not present

## 2024-01-03 DIAGNOSIS — K649 Unspecified hemorrhoids: Secondary | ICD-10-CM | POA: Insufficient documentation

## 2024-01-03 DIAGNOSIS — I10 Essential (primary) hypertension: Secondary | ICD-10-CM | POA: Diagnosis not present

## 2024-01-03 DIAGNOSIS — D259 Leiomyoma of uterus, unspecified: Secondary | ICD-10-CM | POA: Diagnosis not present

## 2024-01-03 DIAGNOSIS — N939 Abnormal uterine and vaginal bleeding, unspecified: Secondary | ICD-10-CM | POA: Diagnosis present

## 2024-01-03 HISTORY — DX: Essential (primary) hypertension: I10

## 2024-01-03 LAB — URINALYSIS, ROUTINE W REFLEX MICROSCOPIC
Bilirubin Urine: NEGATIVE
Glucose, UA: NEGATIVE mg/dL
Hgb urine dipstick: NEGATIVE
Ketones, ur: NEGATIVE mg/dL
Leukocytes,Ua: NEGATIVE
Nitrite: NEGATIVE
Protein, ur: NEGATIVE mg/dL
Specific Gravity, Urine: 1.025 (ref 1.005–1.030)
pH: 7 (ref 5.0–8.0)

## 2024-01-03 LAB — CBC
HCT: 37.2 % (ref 36.0–46.0)
Hemoglobin: 12.1 g/dL (ref 12.0–15.0)
MCH: 27.1 pg (ref 26.0–34.0)
MCHC: 32.5 g/dL (ref 30.0–36.0)
MCV: 83.4 fL (ref 80.0–100.0)
Platelets: 253 10*3/uL (ref 150–400)
RBC: 4.46 MIL/uL (ref 3.87–5.11)
RDW: 12 % (ref 11.5–15.5)
WBC: 6.5 10*3/uL (ref 4.0–10.5)
nRBC: 0 % (ref 0.0–0.2)

## 2024-01-03 LAB — COMPREHENSIVE METABOLIC PANEL
ALT: 16 U/L (ref 0–44)
AST: 15 U/L (ref 15–41)
Albumin: 3.9 g/dL (ref 3.5–5.0)
Alkaline Phosphatase: 69 U/L (ref 38–126)
Anion gap: 7 (ref 5–15)
BUN: 12 mg/dL (ref 6–20)
CO2: 31 mmol/L (ref 22–32)
Calcium: 8.9 mg/dL (ref 8.9–10.3)
Chloride: 103 mmol/L (ref 98–111)
Creatinine, Ser: 0.92 mg/dL (ref 0.44–1.00)
GFR, Estimated: >60 mL/min (ref >60)
Glucose, Bld: 95 mg/dL (ref 70–99)
Potassium: 3.6 mmol/L (ref 3.5–5.1)
Sodium: 141 mmol/L (ref 135–145)
Total Bilirubin: 0.4 mg/dL (ref 0.0–1.2)
Total Protein: 7.2 g/dL (ref 6.5–8.1)

## 2024-01-03 LAB — PREGNANCY, URINE: Preg Test, Ur: NEGATIVE

## 2024-01-03 LAB — OCCULT BLOOD X 1 CARD TO LAB, STOOL: Fecal Occult Bld: NEGATIVE

## 2024-01-03 MED ORDER — IOHEXOL 300 MG/ML  SOLN
100.0000 mL | Freq: Once | INTRAMUSCULAR | Status: AC | PRN
  Administered 2024-01-03: 100 mL via INTRAVENOUS

## 2024-01-03 MED ORDER — HYDROCHLOROTHIAZIDE 25 MG PO TABS: 12.5000 mg | ORAL_TABLET | Freq: Every day | ORAL | 0 refills | Status: DC

## 2024-01-03 NOTE — ED Triage Notes (Addendum)
Intermittent vaginal bleeding x 4 months (longer than her period). Also reports L low back pain with intermittent rectal bleeding. Was dx with HTN but has not been taking medications.

## 2024-01-03 NOTE — ED Provider Notes (Signed)
 [place] EMERGENCY DEPARTMENT AT [facility] Provider Note   CSN: [id number] Arrival date & time: 01/03/24  4098     History Chief Complaint  Patient presents with   Vaginal Bleeding   Rectal Bleeding    [name] is a 38 y.o. female.  Patient with past history significant for hemorrhoid, irregular menstrual bleeding presents emergency department concerns of vaginal bleeding and rectal bleeding.  States that the vaginal bleeding has been ongoing intermittently for about 4 months.  She currently has an IUD in place that has been present for about 3 and half years.  States that the bleeding is typically fairly light with no heavy blood loss.  Denies any recent weakness, dizziness, or syncope.  Not on blood thinners.  Regarding rectal pain, she states that she noticed some bleeding during bowel movements when she looks in the toilet bowl and tries to wipe.  Has a known history of a hemorrhoid has been present since about 2006 that she has never had manage with general surgery.  Not currently taking medications for this hemorrhoid.   Vaginal Bleeding Rectal Bleeding      Home Medications Prior to Admission medications   Medication Sig Start Date End Date Taking? Authorizing Provider  hydrochlorothiazide (HYDRODIURIL) 25 MG tablet Take 0.5 tablets (12.5 mg total) by mouth daily. 01/03/24 02/02/24 Yes Smitty Knudsen, PA-C  ibuprofen (ADVIL,MOTRIN) 200 MG tablet Take 200 mg every 6 (six) hours as needed by mouth.    [provider]  imiquimod (ALDARA) 5 % cream Apply topically 3 (three) times a week. Apply small amt to area [place], [place] and Fri  Wash off am 11/26/19   Harrington Challenger, NP      Allergies    Patient has no known allergies.    Review of Systems   Review of Systems  Gastrointestinal:  Positive for hematochezia.  Genitourinary:  Positive for vaginal bleeding.  All other systems reviewed and are negative.   Physical Exam Updated Vital Signs BP (!)  168/109   Pulse 77   Temp (!) 97.5 F (36.4 C) (Oral)   Resp 16   Ht 5\' 8"  (1.727 m)   Wt 106.6 kg   SpO2 99%   BMI 35.73 kg/m  Physical Exam Vitals and nursing note reviewed. Exam conducted with a chaperone present.  Constitutional:      General: She is not in acute distress.    Appearance: She is well-developed.  HENT:     Head: Normocephalic and atraumatic.  Eyes:     Conjunctiva/sclera: Conjunctivae normal.  Cardiovascular:     Rate and Rhythm: Normal rate and regular rhythm.     Heart sounds: No murmur heard. Pulmonary:     Effort: Pulmonary effort is normal. No respiratory distress.     Breath sounds: Normal breath sounds.  Abdominal:     Palpations: Abdomen is soft.     Tenderness: There is no abdominal tenderness.  Genitourinary:    Comments: Prolapsed internal hemorrhoid present.  Likely grade 3. Musculoskeletal:        General: No swelling.     Cervical back: Neck supple.  Skin:    General: Skin is warm and dry.     Capillary Refill: Capillary refill takes less than 2 seconds.  Neurological:     Mental Status: She is alert.  Psychiatric:        Mood and Affect: Mood normal.     ED Results / Procedures / Treatments   Labs (  all labs ordered are listed, but only abnormal results are displayed) Labs Reviewed  COMPREHENSIVE METABOLIC PANEL  CBC  PREGNANCY, URINE  URINALYSIS, ROUTINE W REFLEX MICROSCOPIC  OCCULT BLOOD X 1 CARD TO LAB, STOOL    EKG None  Radiology US PELVIC COMPLETE W TRANSVAGINAL AND TORSION R/O Result Date: 01/03/2024 CLINICAL DATA:  Intermittent abnormal uterine bleeding for 4 months. LMP unknown. EXAM: TRANSABDOMINAL AND TRANSVAGINAL ULTRASOUND OF PELVIS DOPPLER ULTRASOUND OF OVARIES TECHNIQUE: Both transabdominal and transvaginal ultrasound examinations of the pelvis were performed. Transabdominal technique was performed for global imaging of the pelvis including uterus, ovaries, adnexal regions, and pelvic cul-de-sac. It was  necessary to proceed with endovaginal exam following the transabdominal exam to visualize the uterus and endometrium. Color and duplex Doppler ultrasound was utilized to evaluate blood flow to the ovaries. COMPARISON:  Pelvic ultrasound 10/05/2017 and CT 01/03/2024 FINDINGS: Uterus Measurements: 10.9 x 5.2 x 7.7 cm = volume: 232 mL. A fibroid in the right posterior uterus measures 1.3 x 1.0 x 1.1 cm. A second fibroid in the anterior lower uterine segment measures 3.0 x 1.0 x 2.6 cm. Endometrium Thickness: 8 mm.  IUD in expected position. Right ovary Measurements: 3.6 x 2.4 x 2.5 cm = volume: 11.5 mL. Normal appearance/no adnexal mass. Left ovary Measurements: 2.5 x 2.5 x 2.1 cm = volume:  mL.  7.5 Pulsed Doppler evaluation of both ovaries demonstrates normal low-resistance arterial and venous waveforms. Other findings No abnormal free fluid. IMPRESSION: 1. Unremarkable left ovary.  No mass or torsion. 2. IUD in expected position. 3. Uterine fibroids. 4. If bleeding remains unresponsive to hormonal or medical therapy, sonohysterogram should be considered for focal lesion work-up. (Ref: Radiological Reasoning: Algorithmic Workup of Abnormal Vaginal Bleeding with Endovaginal Sonography and Sonohysterography. AJR 2008; 295:A21-30) Electronically Signed   By: Minerva Fester M.D.   On: 01/03/2024 15:30   CT ABDOMEN PELVIS W CONTRAST Result Date: 01/03/2024 CLINICAL DATA:  Low back pain with intermittent rectal bleeding. EXAM: CT ABDOMEN AND PELVIS WITH CONTRAST TECHNIQUE: Multidetector CT imaging of the abdomen and pelvis was performed using the standard protocol following bolus administration of intravenous contrast. RADIATION DOSE REDUCTION: This exam was performed according to the departmental dose-optimization program which includes automated exposure control, adjustment of the mA and/or kV according to patient size and/or use of iterative reconstruction technique. CONTRAST:  OMNIPAQUE IOHEXOL 300 MG/ML   SOLN COMPARISON:  None Available. FINDINGS: Lower chest: No acute abnormality. Hepatobiliary: No cholelithiasis or biliary dilatation is noted. 11 mm low density is noted in inferior portion of right hepatic lobe. 15 mm peripheral low density is noted posteriorly in right hepatic lobe Pancreas: Unremarkable. No pancreatic ductal dilatation or surrounding inflammatory changes. Spleen: Normal in size without focal abnormality. Adrenals/Urinary Tract: Adrenal glands are unremarkable. Kidneys are normal, without renal calculi, focal lesion, or hydronephrosis. Bladder is unremarkable. Stomach/Bowel: Stomach is within normal limits. Appendix appears normal. No evidence of bowel wall thickening, distention, or inflammatory changes. Vascular/Lymphatic: No significant vascular findings are present. No enlarged abdominal or pelvic lymph nodes. Reproductive: Intrauterine device is noted. 3.6 cm complex left ovarian abnormality is noted. Other: No abdominal wall hernia or abnormality. No abdominopelvic ascites. Musculoskeletal: No acute or significant osseous findings. IMPRESSION: 3.6 cm complex left ovarian abnormality is noted. Pelvic ultrasound is recommended for further evaluation. Two ill-defined low densities are noted in right hepatic lobe, the largest measuring 15 mm. These may simply represent hemangiomas, but neoplasm cannot be excluded. When the patient is clinically stable  and able to follow directions and hold their breath (preferably as an outpatient) further evaluation with dedicated abdominal MRI should be considered. Electronically Signed   By: Lupita Raider M.D.   On: 01/03/2024 12:18    Procedures Procedures    Medications Ordered in ED Medications  iohexol (OMNIPAQUE) 300 MG/ML solution 100 mL (100 mLs Intravenous Contrast Given 01/03/24 1130)    ED Course/ Medical Decision Making/ A&P                                 Medical Decision Making Amount and/or Complexity of Data Reviewed Labs:  ordered. Radiology: ordered.  Risk Prescription drug management.   This patient presents to the ED for concern of vaginal bleeding, rectal bleeding.  Differential diagnosis includes uterine fibroids, abnormal uterine bleeding, hemorrhoid, GI bleed   Lab Tests:  I Ordered, and personally interpreted labs.  The pertinent results include: CBC unremarkable with no acute signs of blood loss with hemoglobin stable at 12.1, CMP unremarkable, urine pregnancy negative, UA without evidence of infection, Hemoccult**   Imaging Studies ordered:  I ordered imaging studies including CT abdomen pelvis, pelvic ultrasound I independently visualized and interpreted imaging which showed 3.6 cm complex left ovarian abnormality is noted. Pelvic ultrasound is recommended for further evaluation. Two ill-defined low densities are noted in right hepatic lobe, the largest measuring 15 mm. These may simply represent hemangiomas, but neoplasm cannot be excluded. When the patient is clinically stable and able to follow directions and hold their breath (preferably as an outpatient) further evaluation with dedicated abdominal MRI should be considered. I agree with the radiologist interpretation    Problem List / ED Course:  Patient with past history significant for hemorrhoids, regular menstrual cycles, presents emergency department concerns of vaginal bleeding and rectal bleeding.  She states that this has been ongoing for several months with the vaginal bleeding.  States the bleeding is typically fairly light and she does currently have an IUD in place that is been present for about 3-1/2 years.  She states that the bleeding is typically light without any progression to more systemic symptoms such as dizziness, weakness or lightheadedness.  Not on blood thinners at this time.  Regarding the rectal bleeding, she reports that this is typically painless and notably worsens with increased straining.  Has not had any  follow-up for her hemorrhoid since this presented in 2006. Physical exam is reassuring but there is evidence of an internal hemorrhoid that is prolapsed. Responds to manual reduction. Suspect this is likely the cause of the patient's intermittent bleeding and discomfort with bowel movements. No evidence of thrombosed hemorrhoid. CT imgaging concerning for a left ovarian abnormality and further evaluation with [name] is advised. Ultrasound imaging is reassuring without any specific abnormality seen.  There is evidence of uterine fibroids.  Given reassuring findings, I believe the patient is stable for discharge and outpatient follow-up.  Advised patient that for the hemorrhoid, she should see seen by general surgery and for her concerns of vaginal bleeding and fibroids on ultrasound, she should follow-up with OB/GYN. I did start patient on a low-dose hydrochlorothiazide 12.5 mg for hypertension.  She states that she has previously taken lisinopril for hypertension but had to discontinue this medication due to a cough side effect.  States that she has not tried any other medications and is not currently set up with a PCP.  Given the patient has no history  of any renal disease and labs are reassuring, will start patient on low-dose hydrochlorothiazide.  Advised patient to follow-up with PCP for further management of this hypertension.   Final Clinical Impression(s) / ED Diagnoses Final diagnoses:  Abnormal uterine bleeding  Uterine leiomyoma, unspecified location  Hemorrhoids, unspecified hemorrhoid type  Hypertension, unspecified type    Rx / DC Orders ED Discharge Orders          Ordered    hydrochlorothiazide (HYDRODIURIL) 25 MG tablet  Daily        01/03/24 1714              Smitty Knudsen, PA-C 01/03/24 1737    Melene Plan, DO 01/04/24 [phone]

## 2024-01-03 NOTE — Discharge Instructions (Addendum)
You were seen in the ER today for concerns of vaginal bleeding and rectal bleeding. It appears that your rectal bleeding is from an irritated prolapsed hemorrhoid. Since this has been present for years and not clearing up, I would recommend you have this evaluated by general surgery for possible repair. Your vaginal bleeding appears to be likely due to fibroids. Please follow up with your OBGYN for this. Your labs and imaging are reassuring.  Regarding your blood pressure, I would suggest having this fully treated by a primary care provider, but I have started you on a low dose hydrochlorothiazide prescription. Please take this as prescribed.

## 2024-02-13 ENCOUNTER — Ambulatory Visit (INDEPENDENT_AMBULATORY_CARE_PROVIDER_SITE_OTHER): Payer: 59 | Admitting: Family Medicine

## 2024-02-13 ENCOUNTER — Encounter (HOSPITAL_BASED_OUTPATIENT_CLINIC_OR_DEPARTMENT_OTHER): Payer: Self-pay | Admitting: Family Medicine

## 2024-02-13 VITALS — BP 150/100 | HR 86 | Ht 68.0 in | Wt 242.9 lb

## 2024-02-13 DIAGNOSIS — D259 Leiomyoma of uterus, unspecified: Secondary | ICD-10-CM | POA: Insufficient documentation

## 2024-02-13 DIAGNOSIS — Z0000 Encounter for general adult medical examination without abnormal findings: Secondary | ICD-10-CM

## 2024-02-13 DIAGNOSIS — I10 Essential (primary) hypertension: Secondary | ICD-10-CM | POA: Diagnosis not present

## 2024-02-13 MED ORDER — HYDROCHLOROTHIAZIDE 25 MG PO TABS: 12.5000 mg | ORAL_TABLET | Freq: Every day | ORAL | 1 refills | Status: AC

## 2024-02-13 NOTE — Assessment & Plan Note (Signed)
 Given that she believes her prior OB/GYN office is no longer open, we can provide referral to establish with OB/GYN here, referral placed today.

## 2024-02-13 NOTE — Patient Instructions (Signed)
  Medication Instructions:  Your physician recommends that you continue on your current medications as directed. Please refer to the Current Medication list given to you today. --If you need a refill on any your medications before your next appointment, please call your pharmacy first. If no refills are authorized on file call the office.-- Lab Work: Your physician has recommended that you have lab work today: 1 week before next visit  If you have labs (blood work) drawn today and your tests are completely normal, you will receive your results via MyChart message OR a phone call from our staff.  Please ensure you check your voicemail in the event that you authorized detailed messages to be left on a delegated number. If you have any lab test that is abnormal or we need to change your treatment, we will call you to review the results.    Follow-Up: Your next appointment:   Your physician recommends that you schedule a follow-up appointment in: 1-2 month physical with Dr. de [place]  You will receive a text message or e-mail with a link to a survey about your care and experience with [name] today! We would greatly appreciate your feedback!   Thanks for letting us be apart of your health journey!!  Primary Care and Sports Medicine   Dr. Ceasar Mons [place]   We encourage you to activate your patient portal called "MyChart".  Sign up information is provided on this After Visit Summary.  MyChart is used to connect with patients for Virtual Visits (Telemedicine).  Patients are able to view lab/test results, encounter notes, upcoming appointments, etc.  Non-urgent messages can be sent to your provider as well. To learn more about what you can do with MyChart, please visit --  [email or web address].

## 2024-02-13 NOTE — Progress Notes (Signed)
 New Patient Office Visit  Subjective   Patient ID: [name], female    DOB: 22-Feb-1986  Age: 38 y.o. MRN: [id number]  CC:  Chief Complaint  Patient presents with   New Patient (Initial Visit)    [facility] was recently in hospital found out she had uterine fibroids and also notified her her blood pressure was high     HPI [name] presents to establish care Last PCP - [facility], only went twice  HTN: recently diagnosed during ED evaluation. Was started on hydrochlorothiazide 12.5 mg. Does take medication inconsistently. She does check her BP at home. Readings at home have been better controlled with current medication, recent reading at 119/87. Has noted mild increase in urination with medication. Reports being prescribed lisinopril at one point in the past, but had issues with cough.   In ED was diagnosed with uterine fibroids, was recommended to follow-up with OBGYN. Thinks her last OBGYN has closed.  Patient is originally from [place], has lived here for about 30 years. Patient works in [employer] in [employer]. She enjoys sewing, doing nails, has motorcycle.   Outpatient Encounter Medications as of 02/13/2024  Medication Sig   ibuprofen (ADVIL,MOTRIN) 200 MG tablet Take 200 mg every 6 (six) hours as needed by mouth.   hydrochlorothiazide (HYDRODIURIL) 25 MG tablet Take 0.5 tablets (12.5 mg total) by mouth daily.   [DISCONTINUED] hydrochlorothiazide (HYDRODIURIL) 25 MG tablet Take 0.5 tablets (12.5 mg total) by mouth daily.   [DISCONTINUED] imiquimod (ALDARA) 5 % cream Apply topically 3 (three) times a week. Apply small amt to area Mon, Wed and Fri  Wash off am (Patient not taking: Reported on 02/13/2024)   No facility-administered encounter medications on file as of 02/13/2024.    Past Medical History:  Diagnosis Date   Anemia    Chlamydia    Depression    with 2nd preg   Hypertension    Ovarian cyst    Urinary tract  infection     Past Surgical History:  Procedure Laterality Date   NO PAST SURGERIES      Family History  Problem Relation Age of Onset   Hypertension Mother    Hypertension Father    Diabetes Father    Anesthesia problems Neg Hx     Social History   Socioeconomic History   Marital status: Single    Spouse name: Not on file   Number of children: Not on file   Years of education: Not on file   Highest education level: Not on file  Occupational History   Not on file  Tobacco Use   Smoking status: Former    Current packs/day: 0.00    Types: Cigarettes    Quit date: 12/07/2011    Years since quitting: 12.1    Passive exposure: Past   Smokeless tobacco: Never  Vaping Use   Vaping status: Never Used  Substance and Sexual Activity   Alcohol use: Yes    Comment: occassional   Drug use: No   Sexual activity: Not Currently    Partners: Male    Birth control/protection: Condom    Comment: 1ST intercourse- 15, partners- 5,   Other Topics Concern   Not on file  Social History Narrative   Not on file   Social Drivers of Health   Financial Resource Strain: Not on file  Food Insecurity: Not on file  Transportation Needs: Not on file  Physical Activity: Not on file  Stress: Not on file  Social Connections: Not on file  Intimate Partner Violence: Not on file    Objective   BP (!) 150/100 (BP Location: Right Arm, Patient Position: Sitting, Cuff Size: Normal)   Pulse 86   Ht 5\' 8"  (1.727 m)   Wt 242 lb 14.4 oz (110.2 kg)   SpO2 99%   BMI 36.93 kg/m   Physical Exam  38 year old female in no acute distress Cardiovascular exam with regular rate and rhythm Lungs clear to auscultation bilaterally  Assessment & Plan:   Primary hypertension Assessment & Plan: Blood pressure elevated in office today.  Has had some elevated readings at home as well, however patient has been inconsistent with prescribed blood pressure medication.  Discussed options today, would  recommend addition of amlodipine 5 mg to current blood pressure medication regimen.  Patient would prefer to continue with medication as she has prescribed and will try to take medication more consistently.  Recommend intermittent monitoring of blood pressure at home, DASH diet.  Can also bring home blood pressure cuff to next appointment and we can compare to readings in office to ensure accuracy   Uterine leiomyoma, unspecified location Assessment & Plan: Given that she believes her prior OB/GYN office is no longer open, we can provide referral to establish with OB/GYN here, referral placed today.  Orders: -     Ambulatory referral to Obstetrics / Gynecology  Wellness examination -     CBC with Differential/Platelet; Future -     Comprehensive metabolic panel; Future -     Hemoglobin A1c; Future -     Lipid panel; Future -     TSH Rfx on Abnormal to Free T4; Future  Other orders -     hydroCHLOROthiazide; Take 0.5 tablets (12.5 mg total) by mouth daily.  Dispense: 45 tablet; Refill: 1  Return in about 6 weeks (around 03/26/2024) for CPE with fasting labs 1 week prior.    ___________________________________________ Brentton Wardlow de [place], MD, ABFM, CAQSM Primary Care and Sports Medicine [facility]

## 2024-02-13 NOTE — Assessment & Plan Note (Addendum)
 Blood pressure elevated in office today.  Has had some elevated readings at home as well, however patient has been inconsistent with prescribed blood pressure medication.  Discussed options today, would recommend addition of amlodipine 5 mg to current blood pressure medication regimen.  Patient would prefer to continue with medication as she has prescribed and will try to take medication more consistently.  Recommend intermittent monitoring of blood pressure at home, DASH diet.  Can also bring home blood pressure cuff to next appointment and we can compare to readings in office to ensure accuracy

## 2024-03-15 NOTE — Progress Notes (Deleted)
  Pap 09/11/2019 Negative, Pap due  Mirena Insertion March 2021  Mammogram: Order mammogram (will be 38 in June)  Pelvic US 01/03/24:   Intermittent abnormal uterine bleeding for 4 months. Measurements: 10.9 x 5.2 x 7.7 cm = volume: 232 mL. A fibroid in the right posterior uterus measures 1.3 x 1.0 x 1.1 cm. A second fibroid in the anterior lower uterine segment measures 3.0 x 1.0 x 2.6 cm. Endometrium 8mm. IUD in expected position.

## 2024-03-16 ENCOUNTER — Encounter (HOSPITAL_BASED_OUTPATIENT_CLINIC_OR_DEPARTMENT_OTHER): Admitting: Certified Nurse Midwife

## 2024-04-11 ENCOUNTER — Encounter (HOSPITAL_BASED_OUTPATIENT_CLINIC_OR_DEPARTMENT_OTHER): Admitting: Family Medicine

## 2024-04-13 ENCOUNTER — Encounter (HOSPITAL_BASED_OUTPATIENT_CLINIC_OR_DEPARTMENT_OTHER): Admitting: Certified Nurse Midwife

## 2024-04-17 ENCOUNTER — Encounter (HOSPITAL_BASED_OUTPATIENT_CLINIC_OR_DEPARTMENT_OTHER): Admitting: Family Medicine

## 2024-05-03 ENCOUNTER — Encounter (HOSPITAL_BASED_OUTPATIENT_CLINIC_OR_DEPARTMENT_OTHER): Payer: Self-pay

## 2024-05-14 ENCOUNTER — Encounter (HOSPITAL_BASED_OUTPATIENT_CLINIC_OR_DEPARTMENT_OTHER): Payer: Self-pay

## 2024-06-27 ENCOUNTER — Encounter (HOSPITAL_BASED_OUTPATIENT_CLINIC_OR_DEPARTMENT_OTHER): Admitting: Family Medicine

## 2024-08-29 ENCOUNTER — Encounter (HOSPITAL_BASED_OUTPATIENT_CLINIC_OR_DEPARTMENT_OTHER): Payer: Self-pay | Admitting: Family Medicine

## 2024-08-29 ENCOUNTER — Ambulatory Visit (INDEPENDENT_AMBULATORY_CARE_PROVIDER_SITE_OTHER): Admitting: Family Medicine

## 2024-08-29 VITALS — BP 138/85 | HR 95 | Ht 68.0 in | Wt 225.4 lb

## 2024-08-29 DIAGNOSIS — Z600 Problems of adjustment to life-cycle transitions: Secondary | ICD-10-CM | POA: Diagnosis not present

## 2024-08-29 DIAGNOSIS — Z0000 Encounter for general adult medical examination without abnormal findings: Secondary | ICD-10-CM | POA: Insufficient documentation

## 2024-08-29 DIAGNOSIS — Z1231 Encounter for screening mammogram for malignant neoplasm of breast: Secondary | ICD-10-CM

## 2024-08-29 DIAGNOSIS — K649 Unspecified hemorrhoids: Secondary | ICD-10-CM | POA: Diagnosis not present

## 2024-08-29 NOTE — Patient Instructions (Signed)
  Medication Instructions:  Your physician recommends that you continue on your current medications as directed. Please refer to the Current Medication list given to you today. --If you need a refill on any your medications before your next appointment, please call your pharmacy first. If no refills are authorized on file call the office.-- Lab Work: Your physician has recommended that you have lab work today: today If you have labs (blood work) drawn today and your tests are completely normal, you will receive your results via MyChart message OR a phone call from our staff.  Please ensure you check your voicemail in the event that you authorized detailed messages to be left on a delegated number. If you have any lab test that is abnormal or we need to change your treatment, we will call you to review the results.   Follow-Up: Your next appointment:   Your physician recommends that you schedule a follow-up appointment in: 4 months follow up  with Dr. de Peru  You will receive a text message or e-mail with a link to a survey about your care and experience with us  today! We would greatly appreciate your feedback!   Thanks for letting us  be apart of your health journey!!  Primary Care and Sports Medicine   Dr. Court Distance Peru   We encourage you to activate your patient portal called "MyChart".  Sign up information is provided on this After Visit Summary.  MyChart is used to connect with patients for Virtual Visits (Telemedicine).  Patients are able to view lab/test results, encounter notes, upcoming appointments, etc.  Non-urgent messages can be sent to your provider as well. To learn more about what you can do with MyChart, please visit --  [email or web address].

## 2024-08-29 NOTE — Progress Notes (Signed)
 Subjective:    CC: Annual Physical Exam  HPI: [name] is a 38 y.o. presenting for annual physical  She also has other questions today separate from scheduled physical She reports that she has been going through some things and didn't realize she was going through some things. Requesting referral to therapist.  I reviewed the past medical history, family history, social history, surgical history, and allergies today and no changes were needed.  Please see the problem list section below in epic for further details.  Past Medical History: Past Medical History:  Diagnosis Date   Anemia    Chlamydia    Depression    with 2nd preg   Hypertension    Ovarian cyst    Urinary tract infection    Past Surgical History: Past Surgical History:  Procedure Laterality Date   NO PAST SURGERIES     Social History: Social History   Socioeconomic History   Marital status: Single    Spouse name: Not on file   Number of children: Not on file   Years of education: Not on file   Highest education level: Not on file  Occupational History   Not on file  Tobacco Use   Smoking status: Former    Current packs/day: 0.00    Types: Cigarettes    Quit date: 12/07/2011    Years since quitting: 12.7    Passive exposure: Past   Smokeless tobacco: Never  Vaping Use   Vaping status: Never Used  Substance and Sexual Activity   Alcohol use: Yes    Comment: occassional   Drug use: No   Sexual activity: Not Currently    Partners: Male    Birth control/protection: Condom    Comment: 1ST intercourse- 15, partners- 5,   Other Topics Concern   Not on file  Social History Narrative   Not on file   Social Drivers of Health   Financial Resource Strain: Not on file  Food Insecurity: Not on file  Transportation Needs: Not on file  Physical Activity: Not on file  Stress: Not on file  Social Connections: Not on file   Family History: Family History  Problem Relation Age of Onset    Hypertension Mother    Hypertension Father    Diabetes Father    Anesthesia problems Neg Hx    Allergies: No Known Allergies Medications: See med rec.  Review of Systems: No headache, visual changes, nausea, vomiting, diarrhea, constipation, dizziness, abdominal pain, skin rash, fevers, chills, night sweats, swollen lymph nodes, weight loss, chest pain, body aches, joint swelling, muscle aches, shortness of breath, mood changes, visual or auditory hallucinations.  Objective:    BP 138/85 (BP Location: Right Arm, Patient Position: Sitting, Cuff Size: Large)   Pulse 95   Ht 5' 8 (1.727 m)   Wt 225 lb 6.4 oz (102.2 kg)   SpO2 97%   BMI 34.27 kg/m   General: Well Developed, well nourished, and in no acute distress. Neuro: Alert and oriented x3, extra-ocular muscles intact, sensation grossly intact. Cranial nerves II through XII are intact, motor, sensory, and coordinative functions are all intact. HEENT: Normocephalic, atraumatic, pupils equal round reactive to light, neck supple, no masses, no lymphadenopathy, thyroid nonpalpable. Oropharynx, nasopharynx, external ear canals are unremarkable. Skin: Warm and dry, no rashes noted. Cardiac: Regular rate and rhythm, no murmurs rubs or gallops. Respiratory: Clear to auscultation bilaterally. Not using accessory muscles, speaking in full sentences. Abdominal: Soft, nontender, nondistended, positive bowel sounds, no masses, no  organomegaly. Musculoskeletal: Shoulder, elbow, wrist, hip, knee, ankle stable, and with full range of motion.  Impression and Recommendations:    Wellness examination Assessment & Plan: Routine HCM labs ordered. HCM reviewed/discussed. Anticipatory guidance regarding healthy weight, lifestyle and choices given. Recommend healthy diet.  Recommend approximately 150 minutes/week of moderate intensity exercise Recommend regular dental and vision exams Always use seatbelt/lap and shoulder restraints Recommend using  smoke alarms and checking batteries at least twice a year Recommend using sunscreen when outside Discussed immunization recommendations   Screening mammogram for breast cancer -     3D Screening Mammogram, Left and Right; Future  Hemorrhoids, unspecified hemorrhoid type Assessment & Plan: History of hemorrhoids, continues to have issues with this.  She would like to establish with colorectal surgeon for further evaluation and management, referral placed today.  Orders: -     Ambulatory referral to Colorectal Surgery  Phase of life problem -     Ambulatory referral to Behavioral Health  Return in about 4 months (around 12/29/2024) for hypertension.   ___________________________________________ Khalise Billard de [place], MD, ABFM, CAQSM Primary Care and Sports Medicine [facility]

## 2024-08-29 NOTE — Assessment & Plan Note (Signed)

## 2024-08-29 NOTE — Assessment & Plan Note (Signed)
 History of hemorrhoids, continues to have issues with this.  She would like to establish with colorectal surgeon for further evaluation and management, referral placed today.

## 2024-08-30 LAB — CBC WITH DIFFERENTIAL/PLATELET
Basophils Absolute: 0 x10E3/uL (ref 0.0–0.2)
Basos: 0 %
EOS (ABSOLUTE): 0 x10E3/uL (ref 0.0–0.4)
Eos: 0 %
Hematocrit: 39.7 % (ref 34.0–46.6)
Hemoglobin: 12.5 g/dL (ref 11.1–15.9)
Immature Grans (Abs): 0 x10E3/uL (ref 0.0–0.1)
Immature Granulocytes: 0 %
Lymphocytes Absolute: 2.2 x10E3/uL (ref 0.7–3.1)
Lymphs: 31 %
MCH: 27.2 pg (ref 26.6–33.0)
MCHC: 31.5 g/dL (ref 31.5–35.7)
MCV: 86 fL (ref 79–97)
Monocytes Absolute: 0.4 x10E3/uL (ref 0.1–0.9)
Monocytes: 6 %
Neutrophils Absolute: 4.3 x10E3/uL (ref 1.4–7.0)
Neutrophils: 63 %
Platelets: 302 x10E3/uL (ref 150–450)
RBC: 4.6 x10E6/uL (ref 3.77–5.28)
RDW: 12.4 % (ref 11.7–15.4)
WBC: 7 x10E3/uL (ref 3.4–10.8)

## 2024-08-30 LAB — COMPREHENSIVE METABOLIC PANEL WITH GFR
ALT: 25 IU/L (ref 0–32)
AST: 23 IU/L (ref 0–40)
Albumin: 4.3 g/dL (ref 3.9–4.9)
Alkaline Phosphatase: 68 IU/L (ref 41–116)
BUN/Creatinine Ratio: 11 (ref 9–23)
BUN: 11 mg/dL (ref 6–24)
Bilirubin Total: 0.4 mg/dL (ref 0.0–1.2)
CO2: 23 mmol/L (ref 20–29)
Calcium: 9.4 mg/dL (ref 8.7–10.2)
Chloride: 102 mmol/L (ref 96–106)
Creatinine, Ser: 1.02 mg/dL — ABNORMAL HIGH (ref 0.57–1.00)
Globulin, Total: 2.7 g/dL (ref 1.5–4.5)
Glucose: 88 mg/dL (ref 70–99)
Potassium: 3.9 mmol/L (ref 3.5–5.2)
Sodium: 141 mmol/L (ref 134–144)
Total Protein: 7 g/dL (ref 6.0–8.5)
eGFR: 71 mL/min/1.73 (ref >59)

## 2024-08-30 LAB — LIPID PANEL
Chol/HDL Ratio: 3.1 ratio (ref 0.0–4.4)
Cholesterol, Total: 179 mg/dL (ref 100–199)
HDL: 57 mg/dL (ref >39)
LDL Chol Calc (NIH): 112 mg/dL — ABNORMAL HIGH (ref 0–99)
Triglycerides: 50 mg/dL (ref 0–149)
VLDL Cholesterol Cal: 10 mg/dL (ref 5–40)

## 2024-08-30 LAB — HEMOGLOBIN A1C
Est. average glucose Bld gHb Est-mCnc: 97 mg/dL
Hgb A1c MFr Bld: 5 % (ref 4.8–5.6)

## 2024-08-30 LAB — TSH RFX ON ABNORMAL TO FREE T4: TSH: 1.52 u[IU]/mL (ref 0.450–4.500)

## 2024-09-20 ENCOUNTER — Ambulatory Visit (HOSPITAL_BASED_OUTPATIENT_CLINIC_OR_DEPARTMENT_OTHER): Payer: Self-pay | Admitting: Family Medicine

## 2024-12-31 ENCOUNTER — Ambulatory Visit (HOSPITAL_BASED_OUTPATIENT_CLINIC_OR_DEPARTMENT_OTHER): Admitting: Family Medicine

## 2025-01-16 ENCOUNTER — Ambulatory Visit (HOSPITAL_BASED_OUTPATIENT_CLINIC_OR_DEPARTMENT_OTHER): Admitting: Family Medicine
